# Patient Record
Sex: Female | Born: 1970 | Race: White | Hispanic: Yes | Marital: Married | Smoking: Never smoker
Health system: Southern US, Community
[De-identification: ages and names within clinical notes are randomized; demographics above are authoritative.]

## PROBLEM LIST (undated history)

## (undated) DIAGNOSIS — F419 Anxiety disorder, unspecified: Secondary | ICD-10-CM

## (undated) DIAGNOSIS — I10 Essential (primary) hypertension: Secondary | ICD-10-CM

## (undated) DIAGNOSIS — E785 Hyperlipidemia, unspecified: Secondary | ICD-10-CM

## (undated) HISTORY — DX: Hyperlipidemia, unspecified: E78.5

## (undated) HISTORY — DX: Anxiety disorder, unspecified: F41.9

## (undated) HISTORY — PX: TUBAL LIGATION: SHX77

---

## 2017-04-27 ENCOUNTER — Encounter: Payer: Self-pay | Admitting: Obstetrics and Gynecology

## 2017-04-27 ENCOUNTER — Ambulatory Visit (INDEPENDENT_AMBULATORY_CARE_PROVIDER_SITE_OTHER): Payer: Medicare Other | Admitting: Obstetrics and Gynecology

## 2017-04-27 VITALS — BP 153/104 | HR 80 | Ht 64.0 in | Wt 130.3 lb

## 2017-04-27 DIAGNOSIS — Z9851 Tubal ligation status: Secondary | ICD-10-CM

## 2017-04-27 DIAGNOSIS — Z98891 History of uterine scar from previous surgery: Secondary | ICD-10-CM

## 2017-04-27 DIAGNOSIS — Z01419 Encounter for gynecological examination (general) (routine) without abnormal findings: Secondary | ICD-10-CM | POA: Diagnosis not present

## 2017-04-27 DIAGNOSIS — Z8759 Personal history of other complications of pregnancy, childbirth and the puerperium: Secondary | ICD-10-CM | POA: Diagnosis not present

## 2017-04-27 DIAGNOSIS — Z1239 Encounter for other screening for malignant neoplasm of breast: Secondary | ICD-10-CM | POA: Diagnosis not present

## 2017-04-27 DIAGNOSIS — Z124 Encounter for screening for malignant neoplasm of cervix: Secondary | ICD-10-CM

## 2017-04-27 NOTE — Patient Instructions (Signed)
1. Pap smear is done 2. Mammogram is scheduled for November 2018 3. Screening labs are done through primary care 4. Contraception-tubal ligation 5. Continue with healthy eating and exercise 6. Return in 1 year for annual exam  Health Maintenance, Female Adopting a healthy lifestyle and getting preventive care can go a long way to promote health and wellness. Talk with your health care provider about what schedule of regular examinations is right for you. This is a good chance for you to check in with your provider about disease prevention and staying healthy. In between checkups, there are plenty of things you can do on your own. Experts have done a lot of research about which lifestyle changes and preventive measures are most likely to keep you healthy. Ask your health care provider for more information. Weight and diet Eat a healthy diet  Be sure to include plenty of vegetables, fruits, low-fat dairy products, and lean protein.  Do not eat a lot of foods high in solid fats, added sugars, or salt.  Get regular exercise. This is one of the most important things you can do for your health. ? Most adults should exercise for at least 150 minutes each week. The exercise should increase your heart rate and make you sweat (moderate-intensity exercise). ? Most adults should also do strengthening exercises at least twice a week. This is in addition to the moderate-intensity exercise.  Maintain a healthy weight  Body mass index (BMI) is a measurement that can be used to identify possible weight problems. It estimates body fat based on height and weight. Your health care provider can help determine your BMI and help you achieve or maintain a healthy weight.  For females 32 years of age and older: ? A BMI below 18.5 is considered underweight. ? A BMI of 18.5 to 24.9 is normal. ? A BMI of 25 to 29.9 is considered overweight. ? A BMI of 30 and above is considered obese.  Watch levels of cholesterol  and blood lipids  You should start having your blood tested for lipids and cholesterol at 44 years of age, then have this test every 5 years.  You may need to have your cholesterol levels checked more often if: ? Your lipid or cholesterol levels are high. ? You are older than 44 years of age. ? You are at high risk for heart disease.  Cancer screening Lung Cancer  Lung cancer screening is recommended for adults 44-42 years old who are at high risk for lung cancer because of a history of smoking.  A yearly low-dose CT scan of the lungs is recommended for people who: ? Currently smoke. ? Have quit within the past 15 years. ? Have at least a 30-pack-year history of smoking. A pack year is smoking an average of one pack of cigarettes a day for 1 year.  Yearly screening should continue until it has been 15 years since you quit.  Yearly screening should stop if you develop a health problem that would prevent you from having lung cancer treatment.  Breast Cancer  Practice breast self-awareness. This means understanding how your breasts normally appear and feel.  It also means doing regular breast self-exams. Let your health care provider know about any changes, no matter how small.  If you are in your 20s or 30s, you should have a clinical breast exam (CBE) by a health care provider every 1-3 years as part of a regular health exam.  If you are 40 or older, have  a CBE every year. Also consider having a breast X-ray (mammogram) every year.  If you have a family history of breast cancer, talk to your health care provider about genetic screening.  If you are at high risk for breast cancer, talk to your health care provider about having an MRI and a mammogram every year.  Breast cancer gene (BRCA) assessment is recommended for women who have family members with BRCA-related cancers. BRCA-related cancers include: ? Breast. ? Ovarian. ? Tubal. ? Peritoneal cancers.  Results of the  assessment will determine the need for genetic counseling and BRCA1 and BRCA2 testing.  Cervical Cancer Your health care provider may recommend that you be screened regularly for cancer of the pelvic organs (ovaries, uterus, and vagina). This screening involves a pelvic examination, including checking for microscopic changes to the surface of your cervix (Pap test). You may be encouraged to have this screening done every 3 years, beginning at age 88.  For women ages 63-65, health care providers may recommend pelvic exams and Pap testing every 3 years, or they may recommend the Pap and pelvic exam, combined with testing for human papilloma virus (HPV), every 5 years. Some types of HPV increase your risk of cervical cancer. Testing for HPV may also be done on women of any age with unclear Pap test results.  Other health care providers may not recommend any screening for nonpregnant women who are considered low risk for pelvic cancer and who do not have symptoms. Ask your health care provider if a screening pelvic exam is right for you.  If you have had past treatment for cervical cancer or a condition that could lead to cancer, you need Pap tests and screening for cancer for at least 20 years after your treatment. If Pap tests have been discontinued, your risk factors (such as having a new sexual partner) need to be reassessed to determine if screening should resume. Some women have medical problems that increase the chance of getting cervical cancer. In these cases, your health care provider may recommend more frequent screening and Pap tests.  Colorectal Cancer  This type of cancer can be detected and often prevented.  Routine colorectal cancer screening usually begins at 44 years of age and continues through 44 years of age.  Your health care provider may recommend screening at an earlier age if you have risk factors for colon cancer.  Your health care provider may also recommend using home test  kits to check for hidden blood in the stool.  A small camera at the end of a tube can be used to examine your colon directly (sigmoidoscopy or colonoscopy). This is done to check for the earliest forms of colorectal cancer.  Routine screening usually begins at age 59.  Direct examination of the colon should be repeated every 5-10 years through 44 years of age. However, you may need to be screened more often if early forms of precancerous polyps or small growths are found.  Skin Cancer  Check your skin from head to toe regularly.  Tell your health care provider about any new moles or changes in moles, especially if there is a change in a mole's shape or color.  Also tell your health care provider if you have a mole that is larger than the size of a pencil eraser.  Always use sunscreen. Apply sunscreen liberally and repeatedly throughout the day.  Protect yourself by wearing long sleeves, pants, a wide-brimmed hat, and sunglasses whenever you are outside.  Heart  disease, diabetes, and high blood pressure  High blood pressure causes heart disease and increases the risk of stroke. High blood pressure is more likely to develop in: ? People who have blood pressure in the high end of the normal range (130-139/85-89 mm Hg). ? People who are overweight or obese. ? People who are African American.  If you are 82-54 years of age, have your blood pressure checked every 3-5 years. If you are 78 years of age or older, have your blood pressure checked every year. You should have your blood pressure measured twice-once when you are at a hospital or clinic, and once when you are not at a hospital or clinic. Record the average of the two measurements. To check your blood pressure when you are not at a hospital or clinic, you can use: ? An automated blood pressure machine at a pharmacy. ? A home blood pressure monitor.  If you are between 51 years and 43 years old, ask your health care provider if you  should take aspirin to prevent strokes.  Have regular diabetes screenings. This involves taking a blood sample to check your fasting blood sugar level. ? If you are at a normal weight and have a low risk for diabetes, have this test once every three years after 44 years of age. ? If you are overweight and have a high risk for diabetes, consider being tested at a younger age or more often. Preventing infection Hepatitis B  If you have a higher risk for hepatitis B, you should be screened for this virus. You are considered at high risk for hepatitis B if: ? You were born in a country where hepatitis B is common. Ask your health care provider which countries are considered high risk. ? Your parents were born in a high-risk country, and you have not been immunized against hepatitis B (hepatitis B vaccine). ? You have HIV or AIDS. ? You use needles to inject street drugs. ? You live with someone who has hepatitis B. ? You have had sex with someone who has hepatitis B. ? You get hemodialysis treatment. ? You take certain medicines for conditions, including cancer, organ transplantation, and autoimmune conditions.  Hepatitis C  Blood testing is recommended for: ? Everyone born from [birth year] through 1963. ? Anyone with known risk factors for hepatitis C.  Sexually transmitted infections (STIs)  You should be screened for sexually transmitted infections (STIs) including gonorrhea and chlamydia if: ? You are sexually active and are younger than 44 years of age. ? You are older than 44 years of age and your health care provider tells you that you are at risk for this type of infection. ? Your sexual activity has changed since you were last screened and you are at an increased risk for chlamydia or gonorrhea. Ask your health care provider if you are at risk.  If you do not have HIV, but are at risk, it may be recommended that you take a prescription medicine daily to prevent HIV infection. This is  called pre-exposure prophylaxis (PrEP). You are considered at risk if: ? You are sexually active and do not regularly use condoms or know the HIV status of your partner(s). ? You take drugs by injection. ? You are sexually active with a partner who has HIV.  Talk with your health care provider about whether you are at high risk of being infected with HIV. If you choose to begin PrEP, you should first be tested for HIV. You  should then be tested every 3 months for as long as you are taking PrEP. Pregnancy  If you are premenopausal and you may become pregnant, ask your health care provider about preconception counseling.  If you may become pregnant, take 400 to 800 micrograms (mcg) of folic acid every day.  If you want to prevent pregnancy, talk to your health care provider about birth control (contraception). Osteoporosis and menopause  Osteoporosis is a disease in which the bones lose minerals and strength with aging. This can result in serious bone fractures. Your risk for osteoporosis can be identified using a bone density scan.  If you are 43 years of age or older, or if you are at risk for osteoporosis and fractures, ask your health care provider if you should be screened.  Ask your health care provider whether you should take a calcium or vitamin D supplement to lower your risk for osteoporosis.  Menopause may have certain physical symptoms and risks.  Hormone replacement therapy may reduce some of these symptoms and risks. Talk to your health care provider about whether hormone replacement therapy is right for you. Follow these instructions at home:  Schedule regular health, dental, and eye exams.  Stay current with your immunizations.  Do not use any tobacco products including cigarettes, chewing tobacco, or electronic cigarettes.  If you are pregnant, do not drink alcohol.  If you are breastfeeding, limit how much and how often you drink alcohol.  Limit alcohol intake to  no more than 1 drink per day for nonpregnant women. One drink equals 12 ounces of beer, 5 ounces of wine, or 1 ounces of hard liquor.  Do not use street drugs.  Do not share needles.  Ask your health care provider for help if you need support or information about quitting drugs.  Tell your health care provider if you often feel depressed.  Tell your health care provider if you have ever been abused or do not feel safe at home. This information is not intended to replace advice given to you by your health care provider. Make sure you discuss any questions you have with your health care provider. Document Released: 05/05/2011 Document Revised: 03/27/2016 Document Reviewed: 07/24/2015 Elsevier Interactive Patient Education  [employer].

## 2017-04-27 NOTE — Progress Notes (Signed)
ANNUAL PREVENTATIVE CARE GYN  ENCOUNTER NOTE  Subjective:       Betty Vasquez is a 46 y.o. 382P2002 female here for a routine annual gynecologic exam.  Current complaints: 1.  None  Patient presents to the office today for routine annual history and physical exam [facility] Dr[facility] Leana RoeManhano  She states she has regular menstrual cycles and denies menorrhagia, and dysmenorrhea.  She denies any perimenopausal symptoms or vasomotor symptoms.  She has a history or 2 C-Sections due to large babies without gestational diabetes and tubal ligation for contraception.  Patient is currently not sexually active as her husband is currently in [place]. She admits to a history of cysts in her breast 2 years ago the resolved spontaneously and were not visualized on ultrasound.  No biopsy performed.  Patient has had Mammogram in Nov 2017 with normal results.  She has had normal PAP smears.  No family history of cancer.  Gynecologic History Patient's last menstrual period was 04/06/2017 (exact date). Contraception: abstinence- tubal ligation Last Pap: 04/2016 wnl. Results were: normal Last mammogram: 2017. Results were: normal  Obstetric History OB History  Gravida Para Term Preterm AB Living  2 2 2     2   SAB TAB Ectopic Multiple Live Births          2    # Outcome Date GA Lbr Len/2nd Weight Sex Delivery Anes PTL Lv  2 Term 2006   7 lb 12.8 oz (3.538 kg) M CS-LTranv   LIV  1 Term 1998   8 lb 2.4 oz (3.697 kg) M CS-LTranv   LIV      History reviewed. No pertinent past medical history.  Past Surgical History:  Procedure Laterality Date  . CESAREAN SECTION     2    No current outpatient prescriptions on file prior to visit.   No current facility-administered medications on file prior to visit.     Allergies  Allergen Reactions  . Codeine Palpitations    Social History   Social History  . Marital status: Single    Spouse name: N/A  . Number of children: N/A   . Years of education: N/A   Occupational History  . Not on file.   Social History Main Topics  . Smoking status: Not on file  . Smokeless tobacco: Not on file  . Alcohol use Not on file  . Drug use: Unknown  . Sexual activity: Not on file   Other Topics Concern  . Not on file   Social History Narrative  . No narrative on file    History reviewed. No pertinent family history.  The following portions of the patient's history were reviewed and updated as appropriate: allergies, current medications, past family history, past medical history, past social history, past surgical history and problem list.  Review of Systems ROS Review of Systems - General ROS: negative for - chills, fatigue, fever, hot flashes, night sweats, weight gain or weight loss Psychological ROS: negative for - anxiety, decreased libido, depression, mood swings, physical abuse or sexual abuse Ophthalmic ROS: negative for - blurry vision, eye pain or loss of vision ENT ROS: negative for - headaches, hearing change, visual changes or vocal changes Allergy and Immunology ROS: negative for - hives, itchy/watery eyes or seasonal allergies Hematological and Lymphatic ROS: negative for - bleeding problems, bruising, swollen lymph nodes or weight loss Endocrine ROS: negative for - galactorrhea, hair pattern changes, hot flashes, malaise/lethargy, mood swings, palpitations, polydipsia/polyuria, skin  changes, temperature intolerance or unexpected weight changes Breast ROS: negative for - new or changing breast lumps or nipple discharge Respiratory ROS: negative for - cough or shortness of breath Cardiovascular ROS: negative for - chest pain, irregular heartbeat, palpitations or shortness of breath Gastrointestinal ROS: no abdominal pain, change in bowel habits, or black or bloody stools Genito-Urinary ROS: no dysuria, trouble voiding, or hematuria Musculoskeletal ROS: negative for - joint pain or joint  stiffness Neurological ROS: negative for - bowel and bladder control changes Dermatological ROS: negative for rash and skin lesion changes   Objective:   BP (!) 153/104   Pulse 80   Ht 5\' 4"  (1.626 m)   Wt 130 lb 4.8 oz (59.1 kg)   LMP 04/06/2017 (Exact Date)   BMI 22.37 kg/m  CONSTITUTIONAL: Well-developed, well-nourished female in no acute distress.  PSYCHIATRIC: Normal mood and affect. Normal behavior. Normal judgment and thought content. NEUROLGIC: Alert and oriented to person, place, and time. Normal muscle tone coordination. No cranial nerve deficit noted. HENT:  Normocephalic, atraumatic, External right and left ear normal. Oropharynx is clear and moist EYES: Conjunctivae and EOM are normal. Pupils are equal, round, and reactive to light. No scleral icterus.  NECK: Normal range of motion, supple, no masses.  Normal thyroid.  SKIN: Skin is warm and dry. No rash noted. Not diaphoretic. No erythema. No pallor. CARDIOVASCULAR: Normal heart rate noted, regular rhythm, no murmur. RESPIRATORY: Clear to auscultation bilaterally. Effort and breath sounds normal, no problems with respiration noted. BREASTS: Symmetric in size. No masses, skin changes, nipple drainage, or lymphadenopathy. ABDOMEN: Soft, normal bowel sounds, no distention noted.  No tenderness, rebound or guarding. Well-healed Pfannenstiel incision BLADDER: Normal PELVIC:  External Genitalia: Normal  BUS: Normal  Vagina: Normal  Cervix: Normal; mobile non tender; no lesions  Uterus: Normal- anteroverted; mobile, nontender  Adnexa: Normal- non tender, nonpalpable  RV: External Exam NormaI, No Rectal Masses and Normal Sphincter tone  MUSCULOSKELETAL: Normal range of motion. No tenderness.  No cyanosis, clubbing, or edema.  2+ distal pulses. LYMPHATIC: No Axillary, Supraclavicular, or Inguinal Adenopathy.    Assessment:   Annual gynecologic examination 46 y.o. Contraception: abstinence- tubal ligation Normal  BMI History of cesarean section 2  Plan:  Pap: Pap Co Test Mammogram: Ordered Stool Guaiac Testing:  Not Indicated Labs: pcp Routine preventative health maintenance measures emphasized: Exercise/Diet/Weight control, Tobacco Warnings, Alcohol/Substance use risks, Stress Management, Peer Pressure Issues and Safe Sex Return to Clinic - 1 [address] [place], CMA [place] Melter, PA-S [employer] Herold Harms, MD   I have seen, interviewed, and examined the patient in conjunction with the [employer].A. student and affirm the diagnosis and management plan. Ermal Brzozowski A. Garvin Ellena, MD, FACOG   Note: This dictation was prepared with Dragon dictation along with smaller phrase technology. Any transcriptional errors that result from this process are unintentional.

## 2017-04-29 LAB — PAP IG AND HPV HIGH-RISK
HPV, high-risk: NEGATIVE
PAP SMEAR COMMENT: 0

## 2017-09-22 ENCOUNTER — Ambulatory Visit
Admission: RE | Admit: 2017-09-22 | Discharge: 2017-09-22 | Disposition: A | Discharge status: Home / Self Care | Payer: Self-pay | Source: Ambulatory Visit | Attending: Obstetrics and Gynecology | Admitting: Obstetrics and Gynecology

## 2017-09-22 ENCOUNTER — Encounter: Payer: Self-pay | Admitting: Radiology

## 2017-09-22 DIAGNOSIS — Z1239 Encounter for other screening for malignant neoplasm of breast: Secondary | ICD-10-CM

## 2017-09-22 DIAGNOSIS — Z1231 Encounter for screening mammogram for malignant neoplasm of breast: Secondary | ICD-10-CM | POA: Insufficient documentation

## 2017-09-28 ENCOUNTER — Other Ambulatory Visit: Payer: Self-pay | Admitting: Obstetrics and Gynecology

## 2017-09-28 DIAGNOSIS — N6310 Unspecified lump in the right breast, unspecified quadrant: Secondary | ICD-10-CM

## 2017-09-28 DIAGNOSIS — R928 Other abnormal and inconclusive findings on diagnostic imaging of breast: Secondary | ICD-10-CM

## 2017-10-20 ENCOUNTER — Ambulatory Visit
Admission: RE | Admit: 2017-10-20 | Discharge: 2017-10-20 | Disposition: A | Discharge status: Home / Self Care | Payer: Medicaid Other | Source: Ambulatory Visit | Attending: Obstetrics and Gynecology | Admitting: Obstetrics and Gynecology

## 2017-10-20 DIAGNOSIS — R928 Other abnormal and inconclusive findings on diagnostic imaging of breast: Secondary | ICD-10-CM

## 2017-10-20 DIAGNOSIS — N6310 Unspecified lump in the right breast, unspecified quadrant: Secondary | ICD-10-CM

## 2018-02-02 ENCOUNTER — Other Ambulatory Visit: Payer: Self-pay | Admitting: Neurology

## 2018-02-02 DIAGNOSIS — E348 Other specified endocrine disorders: Secondary | ICD-10-CM

## 2018-02-10 ENCOUNTER — Other Ambulatory Visit: Payer: Self-pay | Admitting: Neurology

## 2018-02-10 ENCOUNTER — Ambulatory Visit (HOSPITAL_COMMUNITY): Payer: Self-pay

## 2018-02-10 DIAGNOSIS — E348 Other specified endocrine disorders: Secondary | ICD-10-CM

## 2018-02-16 ENCOUNTER — Ambulatory Visit: Payer: Self-pay

## 2018-02-16 ENCOUNTER — Ambulatory Visit: Payer: Medicare (Managed Care)

## 2018-02-19 ENCOUNTER — Ambulatory Visit: Payer: Self-pay

## 2018-04-20 NOTE — Progress Notes (Deleted)
ANNUAL PREVENTATIVE CARE GYN  ENCOUNTER NOTE  Subjective:       Betty Vasquez is a 47 y.o. G48P2002 female here for a routine annual gynecologic exam.  Current complaints: 1.  None  Patient presents to the office today for routine annual history and physical exam referral from [facility]- Dr. Leana Roe  She states she has regular menstrual cycles and denies menorrhagia, and dysmenorrhea.  She denies any perimenopausal symptoms or vasomotor symptoms.  She has a history or 2 C-Sections due to large babies without gestational diabetes and tubal ligation for contraception.  Patient is currently not sexually active as her husband is currently in [place] She admits to a history of cysts in her breast 2 years ago the resolved spontaneously and were not visualized on ultrasound.  No biopsy performed.  Patient has had Mammogram in Nov 2017 with normal results.  She has had normal PAP smears.  No family history of cancer.  Gynecologic History No LMP recorded. Contraception: abstinence- tubal ligation Last Pap: 04/27/2017 neg/neg wnl. Results were: normal Last mammogram: 10/2017 birad 1 Results were: normal  Obstetric History OB History  Gravida Para Term Preterm AB Living  2 2 2     2   SAB TAB Ectopic Multiple Live Births          2    # Outcome Date GA Lbr Len/2nd Weight Sex Delivery Anes PTL Lv  2 Term 2006   7 lb 12.8 oz (3.538 kg) M CS-LTranv   LIV  1 Term 1998   8 lb 2.4 oz (3.697 kg) M CS-LTranv   LIV    No past medical history on file.  Past Surgical History:  Procedure Laterality Date  . CESAREAN SECTION     2    No current outpatient medications on file prior to visit.   No current facility-administered medications on file prior to visit.     Allergies  Allergen Reactions  . Codeine Palpitations    Social History   Socioeconomic History  . Marital status: Married    Spouse name: Not on file  . Number of children: Not on file  . Years of education:  Not on file  . Highest education level: Not on file  Occupational History  . Not on file  Social Needs  . Financial resource strain: Not on file  . Food insecurity:    Worry: Not on file    Inability: Not on file  . Transportation needs:    Medical: Not on file    Non-medical: Not on file  Tobacco Use  . Smoking status: Never Smoker  . Smokeless tobacco: Never Used  Substance and Sexual Activity  . Alcohol use: No  . Drug use: No  . Sexual activity: Not Currently  Lifestyle  . Physical activity:    Days per week: Not on file    Minutes per session: Not on file  . Stress: Not on file  Relationships  . Social connections:    Talks on phone: Not on file    Gets together: Not on file    Attends religious service: Not on file    Active member of club or organization: Not on file    Attends meetings of clubs or organizations: Not on file    Relationship status: Not on file  . Intimate partner violence:    Fear of current or ex partner: Not on file    Emotionally abused: Not on file    Physically abused:  Not on file    Forced sexual activity: Not on file  Other Topics Concern  . Not on file  Social History Narrative  . Not on file    Family History  Problem Relation Age of Onset  . Breast cancer Neg Hx   . Ovarian cancer Neg Hx   . Colon cancer Neg Hx   . Diabetes Neg Hx     The following portions of the patient's history were reviewed and updated as appropriate: allergies, current medications, past family history, past medical history, past social history, past surgical history and problem list.  Review of Systems ROS   Objective:   There were no vitals taken for this visit. CONSTITUTIONAL: Well-developed, well-nourished female in no acute distress.  PSYCHIATRIC: Normal mood and affect. Normal behavior. Normal judgment and thought content. NEUROLGIC: Alert and oriented to person, place, and time. Normal muscle tone coordination. No cranial nerve deficit  noted. HENT:  Normocephalic, atraumatic, External right and left ear normal. Oropharynx is clear and moist EYES: Conjunctivae and EOM are normal. Pupils are equal, round, and reactive to light. No scleral icterus.  NECK: Normal range of motion, supple, no masses.  Normal thyroid.  SKIN: Skin is warm and dry. No rash noted. Not diaphoretic. No erythema. No pallor. CARDIOVASCULAR: Normal heart rate noted, regular rhythm, no murmur. RESPIRATORY: Clear to auscultation bilaterally. Effort and breath sounds normal, no problems with respiration noted. BREASTS: Symmetric in size. No masses, skin changes, nipple drainage, or lymphadenopathy. ABDOMEN: Soft, normal bowel sounds, no distention noted.  No tenderness, rebound or guarding. Well-healed Pfannenstiel incision BLADDER: Normal PELVIC:  External Genitalia: Normal  BUS: Normal  Vagina: Normal  Cervix: Normal; mobile non tender; no lesions  Uterus: Normal- anteroverted; mobile, nontender  Adnexa: Normal- non tender, nonpalpable  RV: External Exam NormaI, No Rectal Masses and Normal Sphincter tone  MUSCULOSKELETAL: Normal range of motion. No tenderness.  No cyanosis, clubbing, or edema.  2+ distal pulses. LYMPHATIC: No Axillary, Supraclavicular, or Inguinal Adenopathy.    Assessment:   Annual gynecologic examination 47 y.o. Contraception: abstinence- tubal ligation Normal BMI History of cesarean section 2  Plan:  Pap:Due 2021 Mammogram: Ordered Stool Guaiac Testing:  Not Indicated Labs: pcp Routine preventative health maintenance measures emphasized: Exercise/Diet/Weight control, Tobacco Warnings, Alcohol/Substance use risks, Stress Management, Peer Pressure Issues and Safe Sex Return to Clinic - 1 Year  SunGardCrystal Aleece Loyd, CMA   I have seen, interviewed, and examined the patient in conjunction with the [employer] student and affirm the diagnosis and management plan. Martin A. DeFrancesco, MD, FACOG   Note: This dictation  was prepared with Dragon dictation along with smaller phrase technology. Any transcriptional errors that result from this process are unintentional.

## 2018-04-29 ENCOUNTER — Encounter: Payer: Medicare Other | Admitting: Obstetrics and Gynecology

## 2018-07-29 NOTE — Progress Notes (Signed)
ANNUAL PREVENTATIVE CARE GYN  ENCOUNTER NOTE  Subjective:       Betty Vasquez is a 47 y.o. G22P2002 female here for a routine annual gynecologic exam.  Current complaints: 1.  None  Patient presents to the office today for routine annual history and physical exam . She states she has regular menstrual cycles and denies menorrhagia, and dysmenorrhea.  She denies any perimenopausal symptoms or vasomotor symptoms.  She has a history or 2 C-Sections due to large babies without gestational diabetes and tubal ligation for contraception. She admits to a history of cysts in her breast 2 years ago the resolved spontaneously and were not visualized on ultrasound. No family history of cancer. Contraception is tubal ligation.  No major interval health issues have been identified.  Gynecologic History LMP-07/23/2018 Contraception:- tubal ligation Last Pap: 04/2017 neg/neg  wnl. Results were: normal Last mammogram: 10/2017 birad 1 Results were: normal  Obstetric History OB History  Gravida Para Term Preterm AB Living  2 2 2     2   SAB TAB Ectopic Multiple Live Births          2    # Outcome Date GA Lbr Len/2nd Weight Sex Delivery Anes PTL Lv  2 Term 2006   7 lb 12.8 oz (3.538 kg) M CS-LTranv   LIV  1 Term 1998   8 lb 2.4 oz (3.697 kg) M CS-LTranv   LIV    No past medical history on file.  Past Surgical History:  Procedure Laterality Date  . CESAREAN SECTION     2    No current outpatient medications on file prior to visit.   No current facility-administered medications on file prior to visit.     Allergies  Allergen Reactions  . Codeine Palpitations    Social History   Socioeconomic History  . Marital status: Married    Spouse name: Not on file  . Number of children: Not on file  . Years of education: Not on file  . Highest education level: Not on file  Occupational History  . Not on file  Social Needs  . Financial resource strain: Not on file  . Food insecurity:    Worry:  Not on file    Inability: Not on file  . Transportation needs:    Medical: Not on file    Non-medical: Not on file  Tobacco Use  . Smoking status: Never Smoker  . Smokeless tobacco: Never Used  Substance and Sexual Activity  . Alcohol use: No  . Drug use: No  . Sexual activity: Not Currently  Lifestyle  . Physical activity:    Days per week: Not on file    Minutes per session: Not on file  . Stress: Not on file  Relationships  . Social connections:    Talks on phone: Not on file    Gets together: Not on file    Attends religious service: Not on file    Active member of club or organization: Not on file    Attends meetings of clubs or organizations: Not on file    Relationship status: Not on file  . Intimate partner violence:    Fear of current or ex partner: Not on file    Emotionally abused: Not on file    Physically abused: Not on file    Forced sexual activity: Not on file  Other Topics Concern  . Not on file  Social History Narrative  . Not on file    Family  History  Problem Relation Age of Onset  . Breast cancer Neg Hx   . Ovarian cancer Neg Hx   . Colon cancer Neg Hx   . Diabetes Neg Hx     The following portions of the patient's history were reviewed and updated as appropriate: allergies, current medications, past family history, past medical history, past social history, past surgical history and problem list.  Review of Systems Review of Systems  Constitutional: Negative.   HENT: Negative.   Eyes: Negative.   Respiratory: Negative.   Cardiovascular: Negative.   Gastrointestinal: Negative.   Genitourinary: Negative.   Musculoskeletal: Negative.   Skin: Negative.   Neurological: Negative.   Endo/Heme/Allergies: Negative.   Psychiatric/Behavioral: Negative.      Objective:   BP (!) 170/94   Pulse 80   Ht 5\' 4"  (1.626 m)   Wt 149 lb 4.8 oz (67.7 kg)   LMP 07/23/2018 (Exact Date)   BMI 25.63 kg/m  CONSTITUTIONAL: Well-developed,  well-nourished female in no acute distress.  PSYCHIATRIC: Normal mood and affect. Normal behavior. Normal judgment and thought content. NEUROLGIC: Alert and oriented to person, place, and time. Normal muscle tone coordination. No cranial nerve deficit noted. HENT:  Normocephalic, atraumatic, External right and left ear normal.  EYES: Conjunctivae and EOM are normal. . No scleral icterus.  NECK: Normal range of motion, supple, no masses.  Normal thyroid.  SKIN: Skin is warm and dry. No rash noted. Not diaphoretic. No erythema. No pallor. CARDIOVASCULAR: Normal heart rate noted, regular rhythm, no murmur. RESPIRATORY: Clear to auscultation bilaterally. Effort and breath sounds normal, no problems with respiration noted. BREASTS: Symmetric in size. No masses, skin changes, nipple drainage, or lymphadenopathy. ABDOMEN: Soft, normal bowel sounds, no distention noted.  No tenderness, rebound or guarding. Well-healed Pfannenstiel incision BLADDER: Normal PELVIC: (Unchanged from last visit)  External Genitalia: Normal  BUS: Normal  Vagina: Normal  Cervix: Normal; mobile non tender; no lesions  Uterus: Normal- anteroverted; mobile, nontender  Adnexa: Normal- non tender, nonpalpable  RV: External Exam NormaI, No Rectal Masses and Normal Sphincter tone  MUSCULOSKELETAL: Normal range of motion. No tenderness.  No cyanosis, clubbing, or edema.  2+ distal pulses. LYMPHATIC: No Axillary, Supraclavicular, or Inguinal Adenopathy.    Assessment:   Annual gynecologic examination 47 y.o. Contraception:  tubal ligation Normal BMI History of cesarean section 2 Elevated blood pressure Plan:  Pap: Due 2021 Mammogram: Ordered Stool Guaiac Testing:  Not Indicated Labs: lipid tsh a1c fbs Routine preventative health maintenance measures emphasized: Exercise/Diet/Weight control, Tobacco Warnings, Alcohol/Substance use risks, Stress Management, Peer Pressure Issues and Safe Sex  Maintain blood pressure  diary; follow-up with primary care for further work-up of possible hypertension Return to Clinic - 1 Year  Crystal Bruni, CMA   [employer]  am acting as a Neurosurgeon for [employer].  I have reviewed and concur with the supporting documentation provided by Darol Destine, CMA  Herold Harms, MD  Note: This dictation was prepared with Dragon dictation along with smaller phrase technology. Any transcriptional errors that result from this process are unintentional.

## 2018-08-03 ENCOUNTER — Ambulatory Visit (INDEPENDENT_AMBULATORY_CARE_PROVIDER_SITE_OTHER): Payer: Medicaid Other | Admitting: Obstetrics and Gynecology

## 2018-08-03 ENCOUNTER — Encounter: Payer: Self-pay | Admitting: Obstetrics and Gynecology

## 2018-08-03 VITALS — BP 170/94 | HR 80 | Ht 64.0 in | Wt 149.3 lb

## 2018-08-03 DIAGNOSIS — Z0000 Encounter for general adult medical examination without abnormal findings: Secondary | ICD-10-CM

## 2018-08-03 DIAGNOSIS — Z1239 Encounter for other screening for malignant neoplasm of breast: Secondary | ICD-10-CM

## 2018-08-03 DIAGNOSIS — R030 Elevated blood-pressure reading, without diagnosis of hypertension: Secondary | ICD-10-CM

## 2018-08-03 DIAGNOSIS — Z98891 History of uterine scar from previous surgery: Secondary | ICD-10-CM

## 2018-08-03 DIAGNOSIS — Z01419 Encounter for gynecological examination (general) (routine) without abnormal findings: Secondary | ICD-10-CM

## 2018-08-03 DIAGNOSIS — Z9851 Tubal ligation status: Secondary | ICD-10-CM

## 2018-08-03 NOTE — Patient Instructions (Addendum)
1.  Pap smear is not done.  Next Pap smear is due 2021. 2.  Mammogram is ordered. 3.  Screening labs are ordered. 4.  Continue with healthy eating and exercise. 5.  Return in 1 year for annual exam 6.  Maintain blood pressure diary; take to primary care provider for further evaluation of possible hypertension.  [place] (Health Maintenance, Female) Un estilo de vida saludable y los cuidados preventivos pueden favorecer considerablemente a la salud y Musician. Pregunte a su mdico cul es el cronograma de exmenes peridicos apropiado para usted. Esta es una buena oportunidad para consultarlo sobre cmo prevenir enfermedades y [place] sano. Adems de los controles, hay muchas otras cosas que puede hacer usted mismo. Los expertos han realizado numerosas investigaciones [employer] cambios en el estilo de vida y las medidas de prevencin que, [place], lo ayudarn a mantenerse sano. Solicite a su mdico ms informacin. EL PESO Y LA DIETA Consuma una dieta saludable.  Asegrese de [employer] verduras, frutas, productos lcteos de bajo contenido de [place] y Advertising account planner.  No consuma muchos alimentos de alto contenido de grasas slidas, azcares agregados o sal.  Realice actividad fsica con regularidad. Esta es una de las prcticas ms importantes que puede hacer por su salud. ? La Delorise Shiner de los adultos deben hacer ejercicio durante al menos 130mnutos por semana. El ejercicio debe aumentar la frecuencia cardaca y pActorla transpiracin (ejercicio de [employer]. ? La mayora de los adultos tambin deben hacer ejercicios de elongacin al mToysRusveces a la semana. Agregue esto al su plan de ejercicio de intensidad moderada. Mantenga un peso saludable.  El ndice de masa corporal ([facility] es una medida que puede utilizarse para identificar posibles problemas de pYucaipa Proporciona una estimacin de la grasa corporal basndose en el peso y  la altura. Su mdico puede ayudarle a dRadiation protection practitionerILocklandy a lScientist, forensico mTheatre managerun peso saludable.  Para las mujeres de 20aos o ms: ? Un IMetro Health Asc LLC Dba Metro Health Oam Surgery Centermenor de 18,5 se considera bajo peso. ? Un IWillow Springs Centerentre 18,5 y 24,9 es normal. ? Un IConnecticut Childrens Medical Centerentre 25 y 29,9 se considera sobrepeso. ? Un IMC de 30 o ms se considera obesidad. Observe los niveles de colesterol y lpidos en la sangre.  Debe comenzar a rEnglish as a second language teacherde lpidos y cResearch officer, trade unionen la sangre a los 20aos y luego repetirlos cada 566aos  Es posible que nAutomotive engineerlos niveles de colesterol con mayor frecuencia si: ? Sus niveles de lpidos y colesterol son altos. ? Es mayor de [id number] ? Presenta un alto riesgo de padecer enfermedades cardacas. DETECCIN DE CNCER Cncer de pulmn  Se recomienda realizar exmenes de deteccin de cncer de pulmn a personas adultas entre 523y 879aos que estn en riesgo de dHorticulturist, commercialde pulmn por sus antecedentes de consumo de tabaco.  Se recomienda una tomografa computarizada de baja dosis de los pulmones todos los aos a las personas que: ? Fuman actualmente. ? Hayan dejado el hbito en algn momento en los ltimos 15aos. ? Hayan fumado durante 30aos un paquete diario. Un paquete-ao equivale a fumar un promedio de un paquete de cigarrillos diario durante un ao.  Los exmenes de deteccin anuales deben continuar hasta que hayan pasado 15aos desde que dej de fumar.  Ya no debern realizarse si tiene un problema de salud que le impida recibir tratamiento para eScience writerde pulmn. Cncer de mama  Practique la autoconciencia de la mama. Esto significa  reconocer la apariencia normal de sus mamas y cmo las siente.  Tambin significa realizar autoexmenes regulares de [employer]. Informe a su mdico sobre cualquier cambio, sin importar cun pequeo sea.  Si tiene entre 20 y 60 aos, un mdico debe realizarle un examen clnico de las mamas como parte del examen regular de [place], cada  1 a 3aos.  Si tiene 40aos o ms, debe Information systems manager clnico de las Microsoft. Tambin considere realizarse una [place] ([place]) todos los [place].  Si tiene antecedentes familiares de cncer de mama, hable con su mdico para someterse a un estudio gentico.  Si tiene alto riesgo de Chief Financial Officer de mama, hable con su mdico para someterse a Public house manager y [employer].  La evaluacin del gen del cncer de mama (BRCA) se recomienda a mujeres que tengan familiares con cnceres relacionados con el BRCA. Los cnceres relacionados con el BRCA incluyen los siguientes: ? [place]. ? Ovario. ? Trompas. ? Cnceres de peritoneo.  Los resultados de la evaluacin determinarn la necesidad de asesoramiento gentico y de [place] de BRCA1 y BRCA2. Cncer de cuello del tero El mdico puede recomendarle que se haga pruebas peridicas de deteccin de cncer de los rganos de la pelvis (ovarios, tero y vagina). Estas pruebas incluyen un examen plvico, que abarca controlar si se produjeron cambios microscpicos en la superficie del cuello del tero (prueba de Papanicolaou). Pueden recomendarle que se haga estas pruebas cada 3aos, a partir de los 21aos.  A las mujeres que tienen entre 30 y 42aos, los mdicos pueden recomendarles que se sometan a exmenes plvicos y pruebas de Papanicolaou cada 24aos, o a la prueba de Papanicolaou y el examen plvico en combinacin con estudios de deteccin del virus del papiloma humano (VPH) cada 5aos. Algunos tipos de VPH aumentan el riesgo de Chief Financial Officer de cuello del tero. La prueba para la deteccin del VPH tambin puede realizarse a mujeres de cualquier edad cuyos resultados de la prueba de Papanicolaou no sean claros.  Es posible que otros mdicos no recomienden exmenes de deteccin a mujeres no embarazadas que se consideran sujetos de bajo riesgo de Chief Financial Officer de pelvis y que no tienen  sntomas. Pregntele al mdico si un examen plvico de deteccin es adecuado para usted.  Si ha recibido un tratamiento para Science writer cervical o una enfermedad que podra causar cncer, necesitar realizarse una prueba de Papanicolaou y controles durante al menos 35 aos de concluido el [place]. Si no se ha hecho el Papanicolaou con regularidad, debern volver a evaluarse los factores de riesgo (como tener un nuevo compaero sexual), para Teacher, adult education si debe realizarse los estudios nuevamente. Algunas mujeres sufren problemas mdicos que aumentan la probabilidad de Museum/gallery curator cncer de cuello del tero. En estos casos, el mdico podr [employer] se realicen controles y pruebas de Papanicolaou con ms frecuencia. Cncer colorrectal  Este tipo de cncer puede detectarse y a menudo prevenirse.  Por lo general, los estudios de rutina se deben Medical laboratory scientific officer a Field seismologist a Proofreader de los 75 aos y [place] 54 aos.  Sin embargo, el mdico podr aconsejarle que lo haga antes, si tiene factores de riesgo para el cncer de colon.  Tambin puede recomendarle que use un kit de prueba para Hydrologist en la materia fecal.  Es posible que se use una pequea cmara en el extremo de un tubo para examinar directamente el colon (sigmoidoscopia o colonoscopia) a fin de Hydrographic surveyor  formas tempranas de Surveyor, minerals.  Los exmenes de rutina generalmente comienzan a los 50aos.  El examen directo del colon se debe repetir cada 5 a 10aos hasta los 75aos. Sin embargo, es posible que se realicen exmenes con mayor frecuencia, si se detectan formas tempranas de plipos precancerosos o pequeos bultos. Cncer de piel  Revise la piel de la cabeza a los pies con regularidad.  Informe a su mdico si aparecen nuevos lunares o los que tiene se modifican, especialmente en su forma y color.  Tambin notifique al mdico si tiene un lunar que es ms grande que el tamao de una goma de lpiz.  Siempre use pantalla  solar. Aplique pantalla solar de Kerry Dory y repetida a lo largo del Training and development officer.  Protjase usando mangas y [employer], un sombrero de ala ancha y gafas para el sol, siempre que se encuentre en el exterior. ENFERMEDADES CARDACAS, DIABETES E HIPERTENSIN ARTERIAL  La hipertensin arterial causa enfermedades cardacas y [place] el riesgo de ictus. La hipertensin arterial es ms probable en los siguientes casos: ? Las personas que tienen la presin arterial en el extremo del rango normal (100-139/85-89 mm Hg). ? [employer] con sobrepeso u obesidad. ? Scientist, water quality.  Si usted tiene entre 18 y 39 aos, debe medirse la presin arterial cada 3 a 5 aos. Si usted tiene 40 aos o ms, debe medirse la presin arterial [employer]. Debe medirse la presin arterial dos veces: una vez cuando est en un hospital o una clnica y la otra vez cuando est en otro sitio. Registre el promedio de [employer]. Para controlar su presin arterial cuando no est en un hospital o [place], puede usar lo siguiente: ? Jorje Guild automtica para medir la presin arterial en una farmacia. ? Un monitor para medir la presin arterial en el hogar.  Si tiene entre 87 y 60 aos, consulte a su mdico si debe tomar aspirina para prevenir el ictus.  Realcese exmenes de deteccin de la diabetes con regularidad. Esto incluye la toma de [place] de sangre para controlar el nivel de azcar en la sangre durante el [place]. ? Si tiene un peso normal y un bajo riesgo de padecer diabetes, realcese este anlisis cada tres aos despus de los 45aos. ? Si tiene sobrepeso y un alto riesgo de padecer diabetes, considere someterse a este anlisis antes o con mayor frecuencia. PREVENCIN DE INFECCIONES HepatitisB  Si tiene un riesgo ms alto de Museum/gallery curator hepatitis B, debe someterse a un examen de deteccin de este virus. Se considera que tiene un alto riesgo de contraer hepatitis B si: ? Naci en un pas  donde la hepatitis B es frecuente. Pregntele a su mdico qu pases son considerados de Public affairs consultant. ? Sus padres nacieron en un pas de alto riesgo y usted no recibi una vacuna que lo proteja contra la hepatitis B (vacuna contra la hepatitis B). ? [place]. ? [place] agujas para inyectarse drogas. ? Vive con alguien que tiene hepatitis B. ? Ha tenido sexo con alguien que tiene hepatitis B. ? Recibe tratamiento de hemodilisis. ? Toma ciertos medicamentos para el cncer, trasplante de rganos y afecciones autoinmunitarias. Hepatitis C  Se recomienda un anlisis de [place] para: ? [employer] 1945 y 1965. ? Todas las personas que tengan un riesgo de haber contrado hepatitis C. Enfermedades de transmisin sexual (ETS).  Debe realizarse pruebas de deteccin de enfermedades de transmisin sexual (ETS), incluidas gonorrea y  clamidia si: ? Es sexualmente [place] y es menor de [id number]. ? Es mayor de 24aos, y Investment banker, operational informa que corre riesgo de tener este tipo de infecciones. ? La actividad sexual ha cambiado desde que le hicieron la ltima prueba de deteccin y tiene un riesgo mayor de Best boy clamidia o Radio broadcast assistant. Pregntele al mdico si usted tiene riesgo.  Si no tiene el VIH, pero corre riesgo de infectarse por el virus, se recomienda tomar diariamente un medicamento recetado para evitar la infeccin. Esto se conoce como profilaxis previa a la exposicin. Se considera que est en riesgo si: ? Es [place] sexualmente y no [place] preservativos habitualmente o no conoce el estado del VIH de sus Advertising copywriter. ? Se inyecta drogas. ? Es [place] sexualmente con Ardelia Mems pareja que tiene VIH. Consulte a su mdico para saber si tiene un alto riesgo de infectarse por el VIH. Si opta por comenzar la profilaxis previa a la exposicin, primero debe realizarse anlisis de deteccin del VIH. Luego, le harn anlisis cada 60mses mientras est tomando los medicamentos para la profilaxis previa  a la exposicin. [facility] Si es premenopusica y puede quedar eChannel [place] solicite a su mdico asesoramiento previo a la concepcin.  Si puede quedar embarazada, tome 400 a [id number](mcg) de cido fAnheuser-Busch  Si desea evitar el embarazo, hable con su mdico sobre el control de la natalidad (anticoncepcin). OSTEOPOROSIS Y MENOPAUSIA  La osteoporosis es una enfermedad en la que los huesos pierden los minerales y la fuerza por el avance de la edad. El resultado pueden ser fracturas graves en los hMin[place] El riesgo de osteoporosis puede identificarse con uArdelia Memsprueba de densidad sea.  Si tiene 65aos o ms, o si est en riesgo de sufrir osteoporosis y fracturas, pregunte a su mdico si debe someterse a exmenes.  Consulte a su mdico si debe tomar un suplemento de calcio o de vitamina D para reducir el riesgo de osteoporosis.  La menopausia puede presentar ciertos sntomas fsicos y rGaffer  La terapia de reemplazo hormonal puede reducir algunos de estos sntomas y rGaffer Consulte a su mdico para saber si la terapia de reemplazo hormonal es conveniente para usted. INSTRUCCIONES PARA EL CUIDADO EN EL HOGAR  Realcese los estudios de rutina de la salud, dentales y de lPublic librarian  MMacoupin  No consuma ningn producto que contenga tabaco, lo que incluye cigarrillos, tabaco de mHigher education careers advisero cPsychologist, sport and exercise  Si est embarazada, no beba alcohol.  Si est amamantando, reduzca el consumo de alcohol y la frecuencia con la que consume.  Si es mujer y no est embarazada limite el consumo de alcohol a no ms de 1 medida por da. Una medida equivale a 12onzas de cerveza, 5onzas de vino o 1onzas de bebidas alcohlicas de alta graduacin.  No consuma drogas.  No comparta agujas.  Solicite ayuda a su mdico si necesita apoyo o informacin para abandonar las drogas.  Informe a su mdico si a menudo se siente deprimido.  Notifique a su  mdico si alguna vez ha sido vctima de abuso o si no se siente seguro en su hogar. Esta informacin no tiene cMarine scientistel consejo del mdico. Asegrese de hacerle al mdico cualquier pregunta que tenga. Document Released: 10/09/2011 Document Revised: 11/10/2014 Document Reviewed: 07/24/2015 Elsevier Interactive Patient Education  2[employer]

## 2018-08-04 DIAGNOSIS — R030 Elevated blood-pressure reading, without diagnosis of hypertension: Secondary | ICD-10-CM | POA: Insufficient documentation

## 2018-08-06 ENCOUNTER — Other Ambulatory Visit: Payer: Medicaid Other

## 2018-08-07 LAB — LIPID PANEL
CHOL/HDL RATIO: 3.7 ratio (ref 0.0–4.4)
CHOLESTEROL TOTAL: 167 mg/dL (ref 100–199)
HDL: 45 mg/dL (ref >39)
LDL Calculated: 93 mg/dL (ref 0–99)
Triglycerides: 147 mg/dL (ref 0–149)
VLDL Cholesterol Cal: 29 mg/dL (ref 5–40)

## 2018-08-07 LAB — TSH: TSH: 1.53 u[IU]/mL (ref 0.450–4.500)

## 2018-08-07 LAB — HEMOGLOBIN A1C
Est. average glucose Bld gHb Est-mCnc: 108 mg/dL
HEMOGLOBIN A1C: 5.4 % (ref 4.8–5.6)

## 2018-08-07 LAB — GLUCOSE, RANDOM: GLUCOSE: 86 mg/dL (ref 65–99)

## 2018-11-29 ENCOUNTER — Ambulatory Visit: Payer: Medicare Other | Attending: Oncology

## 2018-12-01 ENCOUNTER — Ambulatory Visit
Admission: RE | Admit: 2018-12-01 | Discharge: 2018-12-01 | Disposition: A | Discharge status: Home / Self Care | Payer: Medicare Other | Source: Ambulatory Visit | Attending: Obstetrics and Gynecology | Admitting: Obstetrics and Gynecology

## 2018-12-01 DIAGNOSIS — Z1239 Encounter for other screening for malignant neoplasm of breast: Secondary | ICD-10-CM

## 2018-12-01 DIAGNOSIS — Z1231 Encounter for screening mammogram for malignant neoplasm of breast: Secondary | ICD-10-CM | POA: Diagnosis not present

## 2019-08-05 ENCOUNTER — Encounter: Payer: Medicaid Other | Admitting: Obstetrics and Gynecology

## 2019-08-09 ENCOUNTER — Other Ambulatory Visit: Payer: Self-pay

## 2019-08-09 ENCOUNTER — Encounter: Payer: Self-pay | Admitting: Obstetrics and Gynecology

## 2019-08-09 ENCOUNTER — Ambulatory Visit (INDEPENDENT_AMBULATORY_CARE_PROVIDER_SITE_OTHER): Payer: Medicare Other | Admitting: Obstetrics and Gynecology

## 2019-08-09 VITALS — BP 165/100 | HR 114 | Ht 64.0 in | Wt 154.9 lb

## 2019-08-09 DIAGNOSIS — Z1231 Encounter for screening mammogram for malignant neoplasm of breast: Secondary | ICD-10-CM | POA: Diagnosis not present

## 2019-08-09 DIAGNOSIS — Z01419 Encounter for gynecological examination (general) (routine) without abnormal findings: Secondary | ICD-10-CM | POA: Diagnosis not present

## 2019-08-09 NOTE — Progress Notes (Signed)
HPI:      [name] is a 48 y.o. [id number] who LMP was Patient's last menstrual period was 07/17/2019.  Subjective:   She presents today for her annual examination.  She has no complaints.  She is having normal regular menstrual periods.  She has a tubal ligation for birth control.    Hx: The following portions of the patient's history were reviewed and updated as appropriate:             She  has a past medical history of Anxiety. She does not have any pertinent problems on file. She  has a past surgical history that includes Cesarean section and Tubal ligation. Her family history is not on file. She  reports that she has never smoked. She has never used smokeless tobacco. She reports that she does not drink alcohol or use drugs. She has a current medication list which includes the following prescription(s): amitriptyline and sertraline. She is allergic to codeine.       Review of Systems:  Review of Systems  Constitutional: Denied constitutional symptoms, night sweats, recent illness, fatigue, fever, insomnia and weight loss.  Eyes: Denied eye symptoms, eye pain, photophobia, vision change and visual disturbance.  Ears/Nose/Throat/Neck: Denied ear, nose, throat or neck symptoms, hearing loss, nasal discharge, sinus congestion and sore throat.  Cardiovascular: Denied cardiovascular symptoms, arrhythmia, chest pain/pressure, edema, exercise intolerance, orthopnea and palpitations.  Respiratory: Denied pulmonary symptoms, asthma, pleuritic pain, productive sputum, cough, dyspnea and wheezing.  Gastrointestinal: Denied, gastro-esophageal reflux, melena, nausea and vomiting.  Genitourinary: Denied genitourinary symptoms including symptomatic vaginal discharge, pelvic relaxation issues, and urinary complaints.  Musculoskeletal: Denied musculoskeletal symptoms, stiffness, swelling, muscle weakness and myalgia.  Dermatologic: Denied dermatology symptoms, rash and scar.  Neurologic:  Denied neurology symptoms, dizziness, headache, neck pain and syncope.  Psychiatric: Denied psychiatric symptoms, anxiety and depression.  Endocrine: Denied endocrine symptoms including hot flashes and night sweats.   Meds:   Current Outpatient Medications on File Prior to Visit  Medication Sig Dispense Refill  . amitriptyline (ELAVIL) 25 MG tablet     . sertraline (ZOLOFT) 50 MG tablet Take 50 mg by mouth daily.     No current facility-administered medications on file prior to visit.     Objective:     Vitals:   08/09/19 1342  BP: (!) 165/100  Pulse: (!) 114              Physical examination General NAD, Conversant  HEENT Atraumatic; Op clear with mmm.  Normo-cephalic. Pupils reactive. Anicteric sclerae  Thyroid/Neck Smooth without nodularity or enlargement. Normal ROM.  Neck Supple.  Skin No rashes, lesions or ulceration. Normal palpated skin turgor. No nodularity.  Breasts: No masses or discharge.  Symmetric.  No axillary adenopathy.  Lungs: Clear to auscultation.No rales or wheezes. Normal Respiratory effort, no retractions.  Heart: NSR.  No murmurs or rubs appreciated. No periferal edema  Abdomen: Soft.  Non-tender.  No masses.  No HSM. No hernia  Extremities: Moves all appropriately.  Normal ROM for age. No lymphadenopathy.  Neuro: Oriented to PPT.  Normal mood. Normal affect.     Pelvic:   Vulva: Normal appearance.  No lesions.  Vagina: No lesions or abnormalities noted.  Support: Normal pelvic support.  Urethra No masses tenderness or scarring.  Meatus Normal size without lesions or prolapse.  Cervix: Normal appearance.  No lesions.  Anus: Normal exam.  No lesions.  Perineum: Normal exam.  No lesions.  Bimanual   Uterus: Normal size.  Non-tender.  Mobile.  AV.  Adnexae: No masses.  Non-tender to palpation.  Cul-de-sac: Negative for abnormality.      Assessment:    [id number] Patient Active Problem List   Diagnosis Date Noted  . Elevated blood  pressure reading 08/04/2018  . History of tubal ligation 04/27/2017  . History of 2 cesarean sections 04/27/2017     1. Well woman exam with routine gynecological exam   2. Encounter for screening mammogram for malignant neoplasm of breast       Plan:            1.  Basic Screening Recommendations The basic screening recommendations for asymptomatic women were discussed with the patient during her visit.  The age-appropriate recommendations were discussed with her and the rational for the tests reviewed.  When I am informed by the patient that another primary care physician has previously obtained the age-appropriate tests and they are up-to-date, only outstanding tests are ordered and referrals given as necessary.  Abnormal results of tests will be discussed with her when all of her results are completed. Mammogram ordered- patient to present when fasting for lab work- Pap smear next year. Orders Orders Placed This Encounter  Procedures  . MM 3D SCREEN BREAST BILATERAL  . Hemoglobin A1c  . Lipid panel  . TSH    No orders of the defined types were placed in this encounter.       F/U  Return in about 1 year (around 08/08/2020) for Annual Physical.  Finis Bud, M.D. 08/09/2019 2:13 PM

## 2019-08-09 NOTE — Progress Notes (Signed)
Patient comes in today for annual exam. She has no concerns today. Labs and mammogram ordered.  

## 2019-08-12 ENCOUNTER — Other Ambulatory Visit: Payer: Medicare Other

## 2019-08-12 ENCOUNTER — Other Ambulatory Visit: Payer: Self-pay

## 2019-08-13 LAB — LIPID PANEL
Chol/HDL Ratio: 4.9 ratio — ABNORMAL HIGH (ref 0.0–4.4)
Cholesterol, Total: 194 mg/dL (ref 100–199)
HDL: 40 mg/dL (ref >39)
LDL Chol Calc (NIH): 121 mg/dL — ABNORMAL HIGH (ref 0–99)
Triglycerides: 189 mg/dL — ABNORMAL HIGH (ref 0–149)
VLDL Cholesterol Cal: 33 mg/dL (ref 5–40)

## 2019-08-13 LAB — TSH: TSH: 2.62 u[IU]/mL (ref 0.450–4.500)

## 2019-08-13 LAB — HEMOGLOBIN A1C
Est. average glucose Bld gHb Est-mCnc: 111 mg/dL
Hgb A1c MFr Bld: 5.5 % (ref 4.8–5.6)

## 2019-12-05 ENCOUNTER — Ambulatory Visit
Admission: RE | Admit: 2019-12-05 | Discharge: 2019-12-05 | Disposition: A | Discharge status: Home / Self Care | Payer: Medicare Other | Source: Ambulatory Visit | Attending: Obstetrics and Gynecology | Admitting: Obstetrics and Gynecology

## 2019-12-05 DIAGNOSIS — Z1231 Encounter for screening mammogram for malignant neoplasm of breast: Secondary | ICD-10-CM

## 2020-01-15 IMAGING — MG DIGITAL SCREENING BILATERAL MAMMOGRAM WITH TOMO AND CAD
8 series · 8 of 24 positions shown · non-contrast
Comparison: Previous exam(s).

CLINICAL DATA: Screening.

EXAM:
DIGITAL SCREENING BILATERAL MAMMOGRAM WITH TOMO AND CAD

[R CC synth-2D]
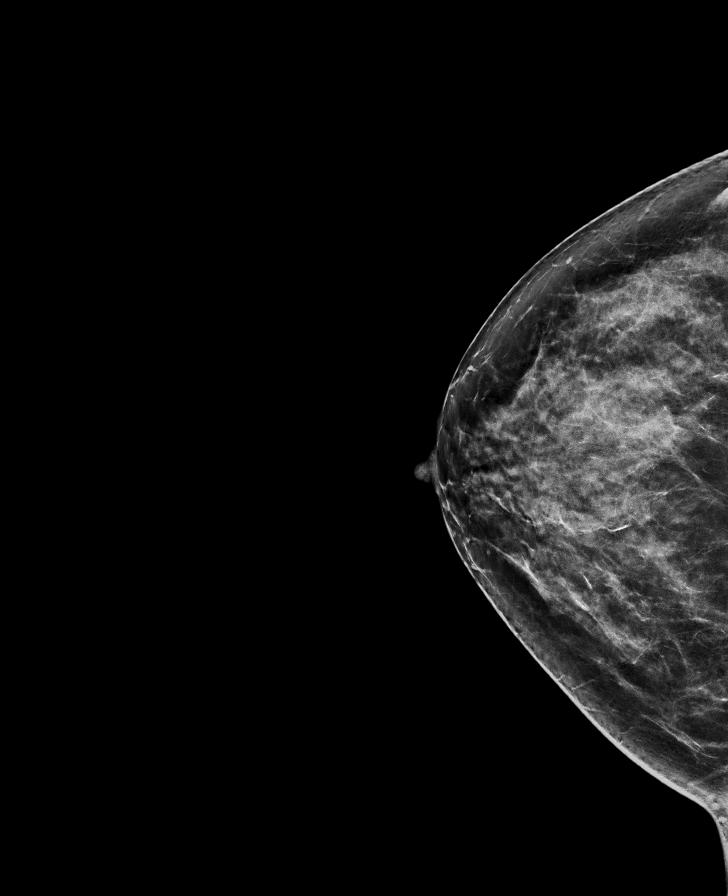

[L CC synth-2D]
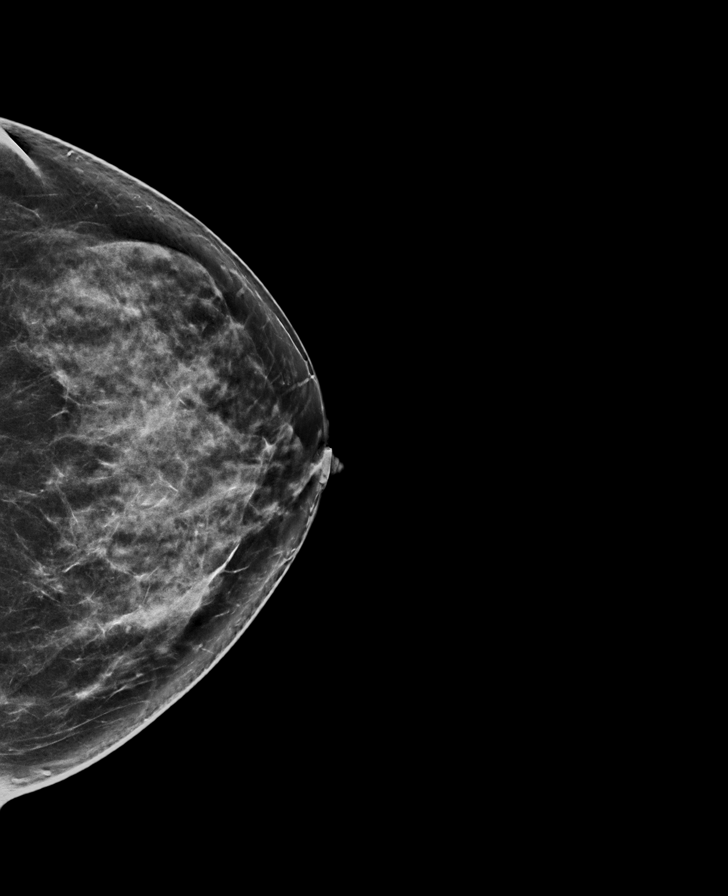

[R MLO synth-2D]
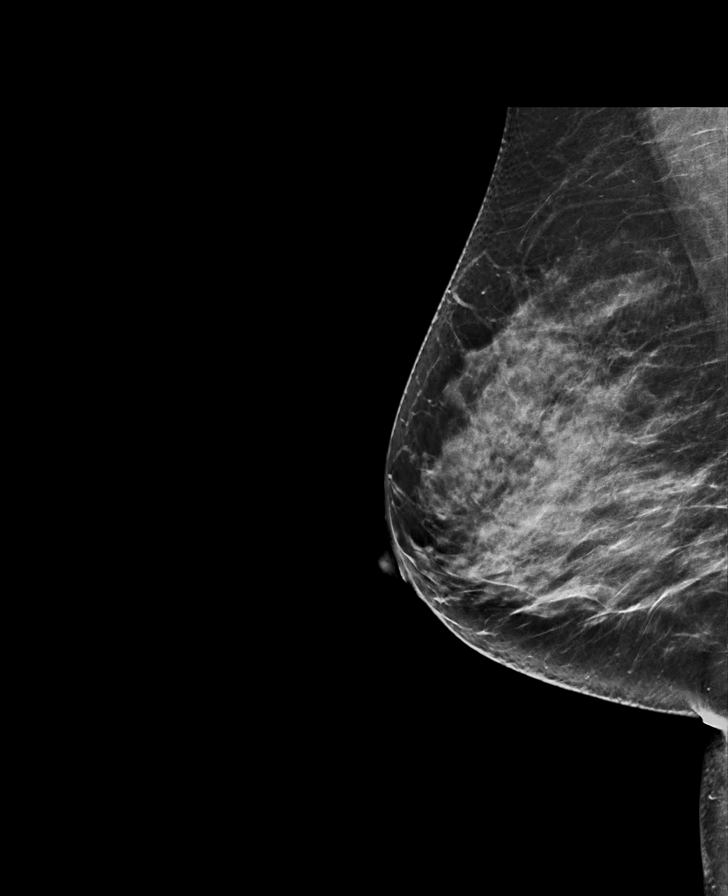

[L MLO synth-2D]
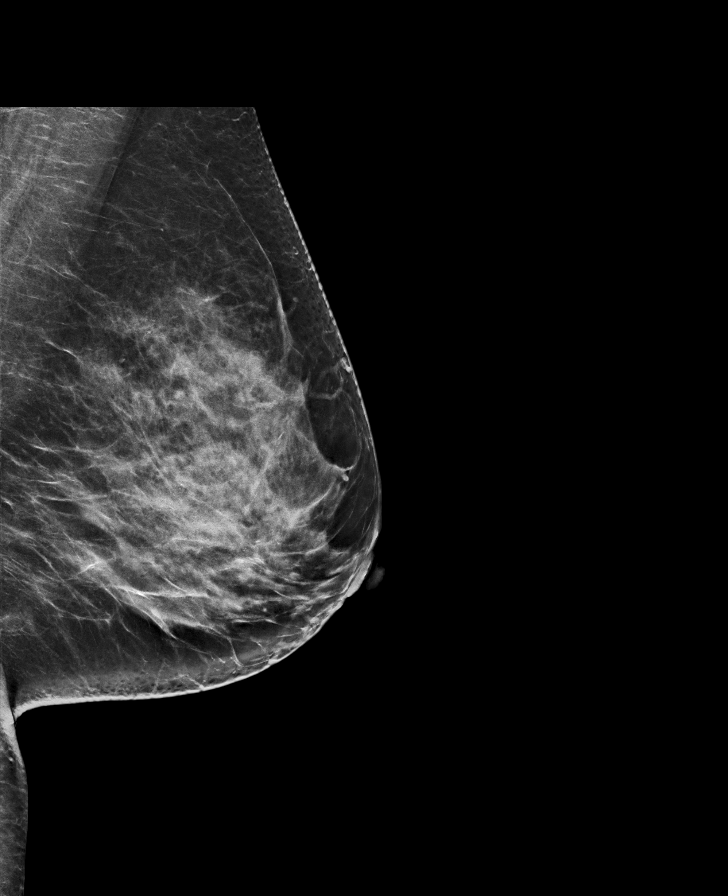

[L MLO tomo · tomo slice 38/75.0]
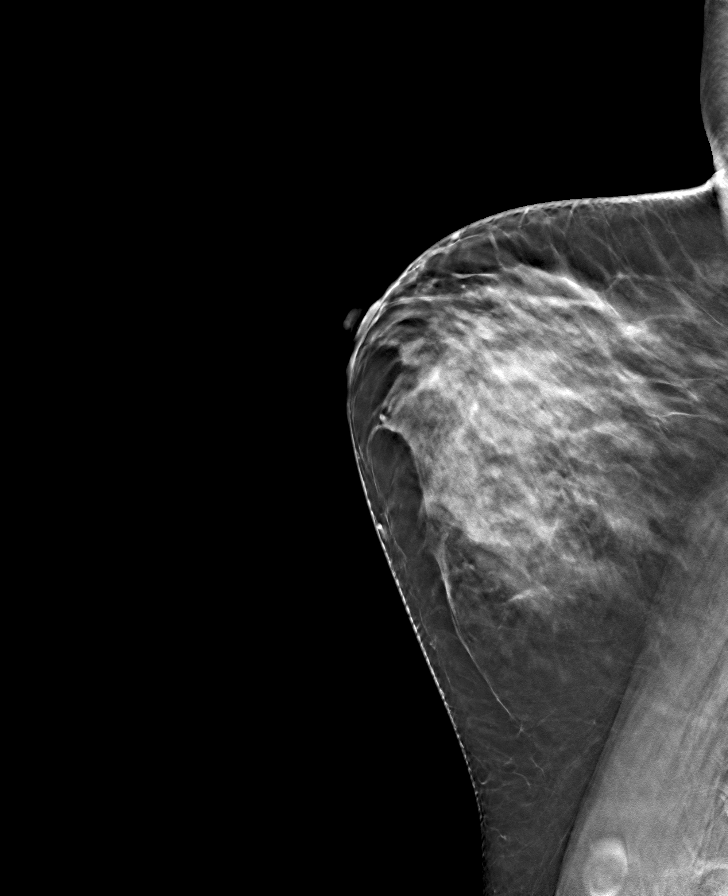

[R CC tomo · tomo slice 37/73.0]
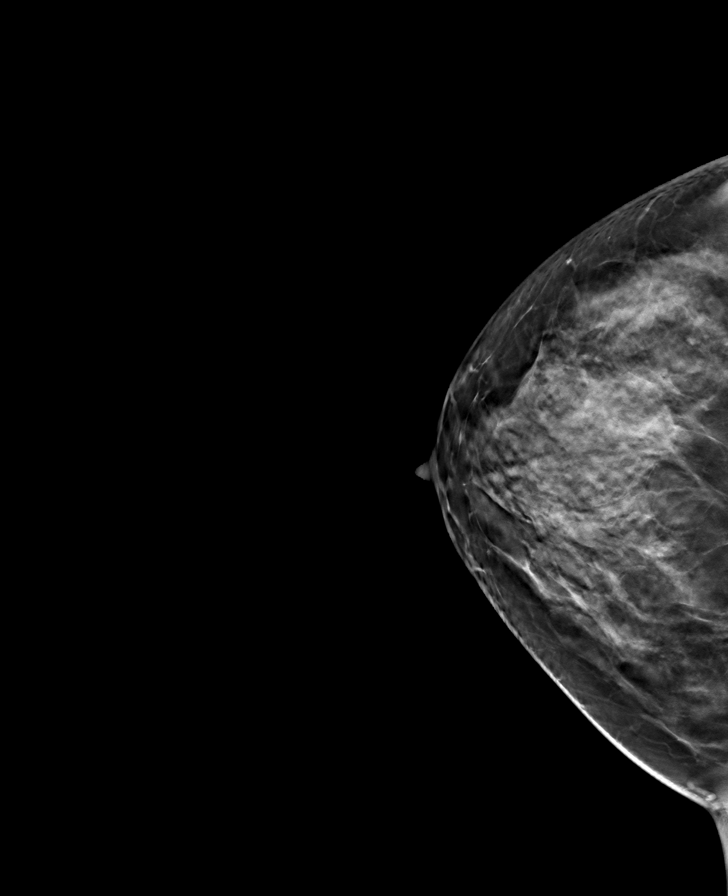

[L CC tomo · tomo slice 39/76.0]
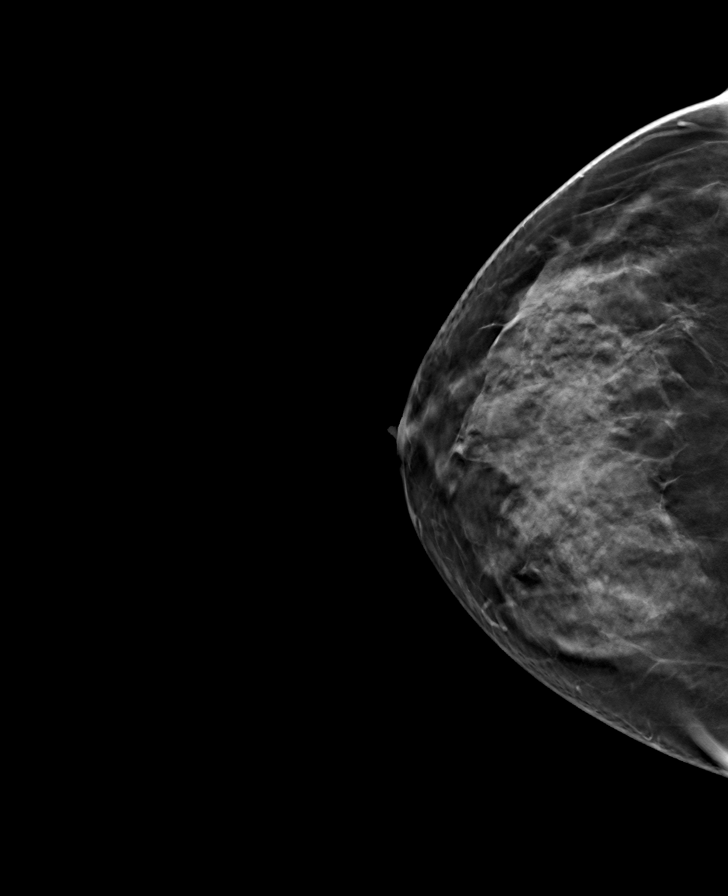

[R MLO tomo · tomo slice 37/74.0]
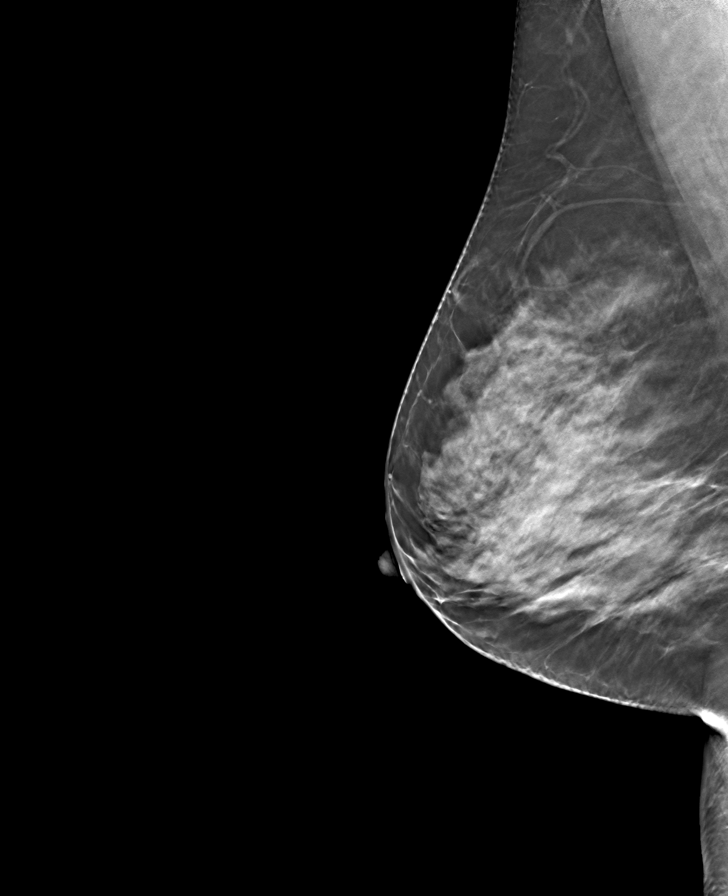

[8 of 24 positions shown; findings below may reference images not displayed]

ACR Breast Density Category c: The breast tissue is heterogeneously
dense, which may obscure small masses.
FINDINGS: There are no findings suspicious for malignancy. Images were
processed with CAD.
IMPRESSION: No mammographic evidence of malignancy. A result letter of this
screening mammogram will be mailed directly to the patient.

RECOMMENDATION:
Screening mammogram in one year. (Code:[id number])

BI-RADS CATEGORY  1: Negative.

## 2020-04-28 ENCOUNTER — Other Ambulatory Visit: Payer: Self-pay

## 2020-04-28 ENCOUNTER — Encounter: Payer: Self-pay | Admitting: Emergency Medicine

## 2020-04-28 ENCOUNTER — Emergency Department
Admission: EM | Admit: 2020-04-28 | Discharge: 2020-04-28 | Disposition: A | Discharge status: Home / Self Care | Payer: Medicare Other | Attending: Emergency Medicine | Admitting: Emergency Medicine

## 2020-04-28 DIAGNOSIS — Y92009 Unspecified place in unspecified non-institutional (private) residence as the place of occurrence of the external cause: Secondary | ICD-10-CM | POA: Insufficient documentation

## 2020-04-28 DIAGNOSIS — I10 Essential (primary) hypertension: Secondary | ICD-10-CM | POA: Diagnosis not present

## 2020-04-28 DIAGNOSIS — L03116 Cellulitis of left lower limb: Secondary | ICD-10-CM

## 2020-04-28 DIAGNOSIS — S80862A Insect bite (nonvenomous), left lower leg, initial encounter: Secondary | ICD-10-CM | POA: Diagnosis present

## 2020-04-28 DIAGNOSIS — Y9389 Activity, other specified: Secondary | ICD-10-CM | POA: Insufficient documentation

## 2020-04-28 DIAGNOSIS — W57XXXA Bitten or stung by nonvenomous insect and other nonvenomous arthropods, initial encounter: Secondary | ICD-10-CM | POA: Diagnosis not present

## 2020-04-28 DIAGNOSIS — Y999 Unspecified external cause status: Secondary | ICD-10-CM | POA: Insufficient documentation

## 2020-04-28 DIAGNOSIS — R21 Rash and other nonspecific skin eruption: Secondary | ICD-10-CM | POA: Diagnosis not present

## 2020-04-28 HISTORY — DX: Essential (primary) hypertension: I10

## 2020-04-28 MED ORDER — CEPHALEXIN 500 MG PO CAPS
500.0000 mg | ORAL_CAPSULE | Freq: Once | ORAL | Status: AC
  Administered 2020-04-28: 500 mg via ORAL
  Filled 2020-04-28: qty 1

## 2020-04-28 MED ORDER — CEPHALEXIN 500 MG PO CAPS: 500.0000 mg | ORAL_CAPSULE | Freq: Four times a day (QID) | ORAL | 0 refills | Status: AC

## 2020-04-28 NOTE — ED Provider Notes (Signed)
[facility] EMERGENCY DEPARTMENT Provider Note   CSN: [id number] Arrival date & time: 04/28/20  1849     History Chief Complaint  Patient presents with  . Insect Bite    Betty Vasquez is a 49 y.o. female presents to the emergency department evaluation of insect bite to the left lower leg x1 week.  She describes erythematous pruritic area.  Erythema has spread from 1 week ago.  No fevers.  No pain swelling or edema throughout the calf.  Erythema is localized.  She has not been taking any antibiotics.  She denies any tick bites.  She is unsure what type of insect bit her. HPI     Past Medical History:  Diagnosis Date  . Anxiety   . Hypertension     Patient Active Problem List   Diagnosis Date Noted  . Elevated blood pressure reading 08/04/2018  . History of tubal ligation 04/27/2017  . History of 2 cesarean sections 04/27/2017    Past Surgical History:  Procedure Laterality Date  . CESAREAN SECTION     2  . TUBAL LIGATION       OB History    Gravida  2   Para  2   Term  2   Preterm      AB      Living  2     SAB      TAB      Ectopic      Multiple      Live Births  2           Family History  Problem Relation Age of Onset  . Breast cancer Neg Hx   . Ovarian cancer Neg Hx   . Colon cancer Neg Hx   . Diabetes Neg Hx     Social History   Tobacco Use  . Smoking status: Never Smoker  . Smokeless tobacco: Never Used  Vaping Use  . Vaping Use: Never used  Substance Use Topics  . Alcohol use: No  . Drug use: No    Home Medications Prior to Admission medications   Medication Sig Start Date End Date Taking? Authorizing Provider  amitriptyline (ELAVIL) 25 MG tablet  07/19/19   [provider]  cephALEXin (KEFLEX) 500 MG capsule Take 1 capsule (500 mg total) by mouth 4 (four) times daily for 10 days. 04/28/20 05/08/20  Evon Slack, PA-C  sertraline (ZOLOFT) 50 MG tablet Take 50 mg by mouth daily.     [provider]    Allergies    Codeine  Review of Systems   Review of Systems  Constitutional: Negative for fever.  Respiratory: Negative for shortness of breath.   Cardiovascular: Negative for chest pain.  Musculoskeletal: Negative for arthralgias and joint swelling.  Skin: Positive for rash.  Neurological: Negative for dizziness and headaches.    Physical Exam Updated Vital Signs BP (!) 192/98 (BP Location: Left Arm)   Pulse 98   Temp 98.5 F (36.9 C)   Resp 18   Ht 5\' 4"  (1.626 m)   Wt 72.6 kg   LMP 04/15/2020 (Exact Date)   SpO2 100%   BMI 27.46 kg/m   Physical Exam Constitutional:      Appearance: Normal appearance. She is well-developed.  HENT:     Head: Normocephalic and atraumatic.  Eyes:     Conjunctiva/sclera: Conjunctivae normal.  Cardiovascular:     Rate and Rhythm: Normal rate.  Pulmonary:     Effort: Pulmonary  effort is normal. No respiratory distress.  Musculoskeletal:     Cervical back: Normal range of motion.     Comments: Left lower extremity with no swelling or edema.  No tenderness to palpation.  She has a focal area of mild warmth and erythema, circumferential along the inner distal lower leg.  Area of erythema measures 4 cm in diameter.  No fluctuance or induration.  Sensation intact distally.  Skin:    General: Skin is warm.     Findings: No rash.  Neurological:     Mental Status: She is alert and oriented to person, place, and time.  Psychiatric:        Behavior: Behavior normal.        Thought Content: Thought content normal.     ED Results / Procedures / Treatments   Labs (all labs ordered are listed, but only abnormal results are displayed) Labs Reviewed - No data to display  EKG None  Radiology No results found.  Procedures Procedures (including critical care time)  Medications Ordered in ED Medications  cephALEXin (KEFLEX) capsule 500 mg (500 mg Oral Given 04/28/20 1952)    ED Course  I have reviewed the  triage vital signs and the nursing notes.  Pertinent labs & imaging results that were available during my care of the patient were reviewed by me and considered in my medical decision making (see chart for details).    MDM Rules/Calculators/A&P                          49 year old female with left leg cellulitis, no signs of DVT.  No fluctuance.  Patient is started on cephalexin.  She understands signs symptoms return to ED for. Final Clinical Impression(s) / ED Diagnoses Final diagnoses:  Insect bite of left lower leg, initial encounter  Cellulitis of left leg    Rx / DC Orders ED Discharge Orders         Ordered    cephALEXin (KEFLEX) 500 MG capsule  4 times daily     Discontinue  Reprint     04/28/20 1953           Renata Caprice 04/28/20 Chevis Pretty, MD 04/29/20 [phone]

## 2020-04-28 NOTE — ED Notes (Signed)
Pt triaged with use of onsite interpreter.

## 2020-04-28 NOTE — Discharge Instructions (Addendum)
Please take antibiotics as prescribed.  If any increasing pain swelling warmth redness or fevers return to the ER.

## 2020-04-28 NOTE — ED Triage Notes (Signed)
Pt arrives POV to triage with c/o insect bite to lower left leg about 1 week ago. Pt has what appears to be an infected area which is localized to the area. Pt is in NAD.

## 2020-08-10 ENCOUNTER — Encounter: Payer: Medicare Other | Admitting: Obstetrics and Gynecology

## 2020-08-14 ENCOUNTER — Encounter: Payer: Medicare Other | Admitting: Obstetrics and Gynecology

## 2020-08-21 ENCOUNTER — Encounter: Payer: Self-pay | Admitting: Obstetrics and Gynecology

## 2020-08-21 ENCOUNTER — Other Ambulatory Visit (HOSPITAL_COMMUNITY)
Admission: RE | Admit: 2020-08-21 | Discharge: 2020-08-21 | Disposition: A | Discharge status: Home / Self Care | Payer: Medicare Other | Source: Ambulatory Visit | Attending: Obstetrics and Gynecology | Admitting: Obstetrics and Gynecology

## 2020-08-21 ENCOUNTER — Other Ambulatory Visit: Payer: Self-pay

## 2020-08-21 ENCOUNTER — Ambulatory Visit (INDEPENDENT_AMBULATORY_CARE_PROVIDER_SITE_OTHER): Payer: Medicare Other | Admitting: Obstetrics and Gynecology

## 2020-08-21 VITALS — BP 148/95 | HR 116 | Ht 64.0 in | Wt 159.8 lb

## 2020-08-21 DIAGNOSIS — Z124 Encounter for screening for malignant neoplasm of cervix: Secondary | ICD-10-CM | POA: Diagnosis not present

## 2020-08-21 DIAGNOSIS — Z1151 Encounter for screening for human papillomavirus (HPV): Secondary | ICD-10-CM | POA: Diagnosis not present

## 2020-08-21 DIAGNOSIS — Z01419 Encounter for gynecological examination (general) (routine) without abnormal findings: Secondary | ICD-10-CM

## 2020-08-21 DIAGNOSIS — Z1231 Encounter for screening mammogram for malignant neoplasm of breast: Secondary | ICD-10-CM | POA: Diagnosis not present

## 2020-08-21 NOTE — Progress Notes (Signed)
New Pt present to est care. Pt declined flu vaccine, pap due, mammogram due orders placed.  Pt stated that she was doing well.

## 2020-08-21 NOTE — Progress Notes (Signed)
HPI:      Betty Vasquez is a 49 y.o. [id number] who LMP was Patient's last menstrual period was 08/05/2020.  Subjective:   She presents today for her annual examination.  She has no complaints.  She continues to have normal monthly menses.  She has a tubal ligation for birth control.    Hx: The following portions of the patient's history were reviewed and updated as appropriate:             She  has a past medical history of Anxiety and Hypertension. She does not have any pertinent problems on file. She  has a past surgical history that includes Cesarean section and Tubal ligation. Her family history includes Hypertension in her mother. She  reports that she has never smoked. She has never used smokeless tobacco. She reports that she does not drink alcohol and does not use drugs. She has a current medication list which includes the following prescription(s): amitriptyline, amlodipine, and sertraline. She is allergic to codeine.       Review of Systems:  Review of Systems  Constitutional: Denied constitutional symptoms, night sweats, recent illness, fatigue, fever, insomnia and weight loss.  Eyes: Denied eye symptoms, eye pain, photophobia, vision change and visual disturbance.  Ears/Nose/Throat/Neck: Denied ear, nose, throat or neck symptoms, hearing loss, nasal discharge, sinus congestion and sore throat.  Cardiovascular: Denied cardiovascular symptoms, arrhythmia, chest pain/pressure, edema, exercise intolerance, orthopnea and palpitations.  Respiratory: Denied pulmonary symptoms, asthma, pleuritic pain, productive sputum, cough, dyspnea and wheezing.  Gastrointestinal: Denied, gastro-esophageal reflux, melena, nausea and vomiting.  Genitourinary: Denied genitourinary symptoms including symptomatic vaginal discharge, pelvic relaxation issues, and urinary complaints.  Musculoskeletal: Denied musculoskeletal symptoms, stiffness, swelling, muscle weakness and myalgia.  Dermatologic:  Denied dermatology symptoms, rash and scar.  Neurologic: Denied neurology symptoms, dizziness, headache, neck pain and syncope.  Psychiatric: Denied psychiatric symptoms, anxiety and depression.  Endocrine: Denied endocrine symptoms including hot flashes and night sweats.   Meds:   Current Outpatient Medications on File Prior to Visit  Medication Sig Dispense Refill  . amitriptyline (ELAVIL) 25 MG tablet     . amLODipine (NORVASC) 5 MG tablet Take 5 mg by mouth daily. Take 2 tablets by mouth daily    . sertraline (ZOLOFT) 50 MG tablet Take 50 mg by mouth daily.     No current facility-administered medications on file prior to visit.          Objective:     Vitals:   08/21/20 0903  BP: (!) 148/95  Pulse: (!) 116    Filed Weights   08/21/20 0903  Weight: 159 lb 12.8 oz (72.5 kg)              Physical examination General NAD, Conversant  HEENT Atraumatic; Op clear with mmm.  Normo-cephalic. Pupils reactive. Anicteric sclerae  Thyroid/Neck Smooth without nodularity or enlargement. Normal ROM.  Neck Supple.  Skin No rashes, lesions or ulceration. Normal palpated skin turgor. No nodularity.  Breasts: No masses or discharge.  Symmetric.  No axillary adenopathy.  Lungs: Clear to auscultation.No rales or wheezes. Normal Respiratory effort, no retractions.  Heart: NSR.  No murmurs or rubs appreciated. No periferal edema  Abdomen: Soft.  Non-tender.  No masses.  No HSM. No hernia  Extremities: Moves all appropriately.  Normal ROM for age. No lymphadenopathy.  Neuro: Oriented to PPT.  Normal mood. Normal affect.     Pelvic:   Vulva: Normal appearance.  No lesions.  Vagina: No lesions  or abnormalities noted.  Support: Normal pelvic support.  Urethra No masses tenderness or scarring.  Meatus Normal size without lesions or prolapse.  Cervix: Normal appearance.  No lesions.  Anus: Normal exam.  No lesions.  Perineum: Normal exam.  No lesions.        Bimanual   Uterus: Normal  size.  Non-tender.  Mobile.  AV.  Adnexae: No masses.  Non-tender to palpation.  Cul-de-sac: Negative for abnormality.      Assessment:    [id number] Patient Active Problem List   Diagnosis Date Noted  . Elevated blood pressure reading 08/04/2018  . History of tubal ligation 04/27/2017  . History of 2 cesarean sections 04/27/2017     1. Encounter for well woman exam with routine gynecological exam   2. Pap smear for cervical cancer screening   3. Breast cancer screening by mammogram     Normal exam patient without complaint.   Plan:            1.  Basic Screening Recommendations The basic screening recommendations for asymptomatic women were discussed with the patient during her visit.  The age-appropriate recommendations were discussed with her and the rational for the tests reviewed.  When I am informed by the patient that another primary care physician has previously obtained the age-appropriate tests and they are up-to-date, only outstanding tests are ordered and referrals given as necessary.  Abnormal results of tests will be discussed with her when all of her results are completed.  Routine preventative health maintenance measures emphasized: Exercise/Diet/Weight control, Tobacco Warnings, Alcohol/Substance use risks and Stress Management Pap performed-mammogram ordered-blood work today. Orders Orders Placed This Encounter  Procedures  . MM 3D SCREEN BREAST BILATERAL  . TSH  . Lipid panel  . Hemoglobin A1c    No orders of the defined types were placed in this encounter.           F/U  No follow-ups on file.  Elonda Husky, M.D. 08/21/2020 9:44 AM

## 2020-08-21 NOTE — Patient Instructions (Signed)
Cuidados preventivos en las mujeres de 47 a 76 aos de edad Preventive Care 47-47 Years Old, Female Los cuidados preventivos hacen referencia a las opciones en cuanto a las visitas al mdico y al estilo de vida, las cuales pueden promover la salud y Musician. Esto puede comprender lo siguiente:  Un examen fsico anual. Esto tambin se puede llamar control de bienestar anual.  Visitas regulares al dentista y exmenes oculares.  Vacunas.  Estudios para Engineer, building services.  Opciones saludables de estilo de vida, como seguir una dieta saludable, hacer ejercicio regularmente, no usar drogas ni productos que contengan nicotina y tabaco, y limitar el consumo de alcohol. Qu puedo esperar para mi visita de cuidado preventivo? Examen fsico El mdico revisar lo siguiente:  IT consultant y [place]. Esto se puede usar para calcular el ndice de masa corporal ([place]), que indica si tiene un peso saludable.  Frecuencia cardaca y presin arterial.  Piel para detectar manchas anormales. Asesoramiento Su mdico puede preguntarle acerca de:  Consumo de tabaco, alcohol y drogas.  Su bienestar emocional.  El bienestar en el hogar y sus relaciones personales.  Su actividad sexual.  Sus hbitos de alimentacin.  Su trabajo y [place] laboral.  Mtodos anticonceptivos.  Su ciclo menstrual.  Sus antecedentes de Media planner. Qu vacunas necesito?  [place] antigripal  Se recomienda aplicarse esta vacuna todos los Rudy. Vacuna contra el ttanos, difteria y tos ferina (Tdap)  Es posible que tenga que aplicarse un refuerzo contra el ttanos y la difteria (DT) cada 10aos. Vacuna contra la varicela  Es posible que tenga que aplicrsela si no recibi esta vacuna. Vacuna contra el herpes zster (culebrilla)  Es posible que la necesite despus de los 52 aos de [place]. Vacuna contra el sarampin, rubola y paperas (SRP)  Es posible que necesite aplicarse al menos una dosis de la vacuna  SRP si naci despus de [phone]. Podra tambin necesitar una segunda dosis. Vacuna antineumoccica conjugada (PCV13)  Puede necesitar esta vacuna si tiene determinadas enfermedades y no se vacun anteriormente. [place] antineumoccica de polisacridos (PPSV23)  Quizs tenga que aplicarse una o dos dosis si fuma o si tiene determinadas afecciones. Edward Jolly antimeningoccica conjugada (MenACWY)  Puede necesitar esta vacuna si tiene determinadas afecciones. Vacuna contra la hepatitis A  Es posible que necesite esta vacuna si tiene ciertas afecciones o si viaja o trabaja en lugares en los que podra estar expuesta a la hepatitis A. Vacuna contra la hepatitis B  Es posible que necesite esta vacuna si tiene ciertas afecciones o si viaja o trabaja en lugares en los que podra estar expuesta a la hepatitis B. Vacuna antihaemophilus influenzae tipo B (Hib)  Puede necesitar esta vacuna si tiene determinadas afecciones. Vacuna contra el virus del papiloma humano (VPH)  Si el mdico se lo recomienda, Research scientist (physical sciences) tres dosis a lo largo de 6 meses. Puede recibir las vacunas en forma de dosis individuales o en forma de dos o ms vacunas juntas en la misma inyeccin (vacunas combinadas). Hable con su mdico [employer] y beneficios de las vacunas combinadas. Qu pruebas necesito? Anlisis de [employer] de lpidos y colesterol. Estos se pueden verificar cada 5 aos o, con ms frecuencia, si usted tiene ms de 42 aos de edad.  Anlisis de hepatitisC.  Anlisis de hepatitisB. Pruebas de deteccin  Pruebas de deteccin de cncer de pulmn. Es posible que se le realice esta prueba de deteccin a partir de los 2 aos de edad, si ha fumado durante 30 aos  un paquete diario y sigue fumando o dej el hbito en algn momento en los ltimos 15 aos.  Pruebas de Programme researcher, broadcasting/film/video de Surveyor, minerals. Todos los adultos a partir de los 47 aos de edad y [place] 18 aos de edad deben hacerse esta  prueba de deteccin. El mdico puede recomendarle las pruebas de deteccin a partir de los 47 aos de edad si corre un mayor riesgo. Le realizarn pruebas cada 1 a 10 aos, segn los [place] y el tipo de prueba de Programme researcher, broadcasting/film/video.  Pruebas de deteccin de la diabetes. Esto se Set designer un control del azcar en la sangre (glucosa) despus de no haber comido durante un periodo de tiempo (ayuno). Es posible que se le realice esta prueba cada 1 a 3 aos.  Mamografa. Se puede realizar cada 1 o 2 aos. Hable con su mdico sobre cundo debe comenzar a Engineer, manufacturing de [place] regular. Esto depende de si tiene antecedentes familiares de cncer de mama o no.  Pruebas de deteccin de cncer relacionado con las mutaciones del BRCA. Es posible que se las deba realizar si tiene antecedentes de cncer de mama, de ovario, de trompas o peritoneal.  Examen plvico y prueba de Papanicolaou. Esto se puede realizar cada 8aos a Renato Gails de los 47aos de edad. A partir de los 30 aos, esto se puede Optometrist cada 5 aos si usted se realiza una prueba de Papanicolaou en combinacin con una prueba de deteccin del virus del papiloma humano (VPH). Otras pruebas  Anlisis de enfermedades de transmisin sexual (ETS).  Densitometra sea. Esto se realiza para detectar osteoporosis. Se le puede realizar este examen de deteccin si tiene un riesgo alto de tener osteoporosis. Siga estas instrucciones en su casa: Comida y bebida  Siga una dieta que incluya frutas frescas y verduras, cereales integrales, lcteos descremados y protenas magras.  Tome los suplementos vitamnicos y [employer] se lo haya indicado el mdico.  No beba alcohol si: ? Su mdico le indica no hacerlo. ? Est embarazada, puede estar embarazada o est tratando de quedar embarazada.  Si bebe alcohol: ? Limite la cantidad que consume de 0 a 1 medida por da. ? Est atenta a la cantidad de alcohol que hay en las bebidas que toma. En los  [place], una medida equivale a una botella de cerveza de 12oz (331m), un vaso de vino de 5oz (1458m o un vaso de una bebida alcohlica de alta graduacin de 1oz (4469m Estilo de vidNavistar International Corporationlas encas a diario.  Mantngase activa. Haga al menos 4m28mos de ejercicio 5o ms das Hilton Hotelso consuma ningn producto que contenga nicotina o tabaco, como cigarrillos, cigarrillos electrnicos y tabaco de mascHigher education careers adviser necesita ayuda para dejar de fumar, consulte al mdico.  Si es sexualmente activa, practique sexo seguro. Use un condn u otra forma de mtodo anticonceptivo (anticonceptivos) a fin de evitEnvironmental health practitionerTS (infecciones de transmisin sexual).  Si el mdico se lo indic, tome una dosis baja de aspirina diariamente a partir de los 50 a70 de edadOak Hillsndo volver?  Visite al mdico una vez al ao para una visita de control.  Pregntele al mdico con qu frecuencia debe realizarse un control de la vista y los dientes.  Mantenga su esquema de vacunacin al da. Esta informacin no tiene comoMarine scientistconsejo del mdico. Asegrese de hacerle al mdico cualquier pregunta que tenga. Document Revised: 08/05/2018 Document Reviewed: 08/05/2018 Elsevier Patient Education  2020  [place] de [employer] Breast Self-Awareness El autoexamen de mamas es para Pharmacist, community apariencia y la sensibilidad de las [place]. Es importante autoexaminarse las [place]. Permite detectar un problema en las mamas a tiempo mientras todava es pequeo y puede tratarse. Todas las mujeres deben autoexaminarse las [place], incluso aquellas que se sometieron a implantes mamarios. Informe al mdico si advierte un cambio en las mamas. Lo que necesita:  Un espejo.  Una habitacin bien iluminada. Cmo realizar el autoexamen de mamas El autoexamen de [place] es una forma de aprender qu es normal para sus mamas y revisar si hay cambios. Para hacer un autoexamen de las  mamas: Busque cambios  1. Qutese toda la ropa por encima de la cintura. 2. Prese frente a un espejo en una habitacin con buena iluminacin. 3. Apoye las manos en las caderas. 4. Empuje hacia abajo con las manos. [place] y los pezones en el espejo para ver si una mama o un pezn se ve diferente del otro. Durante el examen, intente determinar si: ? La forma de una mama es diferente. ? El tamao de [place] mama es [place]. ? Hay arrugas, depresiones y protuberancias en [place] y no en la otra. 6. Observe cada mama para buscar cambios en la piel, por ejemplo: ? Enrojecimiento. ? Zonas escamosas. 7. Observe si hay cambios en los pezones, por ejemplo: ? Lquido alrededor [employer]. ? [place]. ? Hoyuelos. ? Enrojecimiento. ? Un cambio en el lugar de los pezones. [place] si hay cambios  1. Acustese en el piso boca arriba. 2. Plpese cada mama. Para hacerlo, siga estos pasos: ? Elija una mama para palpar. ? Coloque el brazo ms cercano a esa mama por encima de la cabeza. ? Use el otro brazo para palpar la zona del pezn de la mama. Plpese la zona con las yemas de los tres dedos del medio y haga crculos con los dedos. Con el primer crculo, presione suavemente. Con el segundo, ms fuerte. Con el tercero, an ms fuerte. ? Siga haciendo crculos con los dedos con las diferentes presiones a medida que desciende por la mama. Detngase cuando sienta las costillas. ? Desplace los dedos un poco hacia el centro del cuerpo. ? Empiece a hacer crculos con los dedos nuevamente y esta vez haga movimientos ascendentes hasta llegar a la clavcula. ? Siga haciendo crculos [place] y [place] llegar a la axila. Recuerde hacerlos con las tres presiones. ? Plpese la otra mama de la misma forma. 3. Sintese o prese en la ducha o la baera. 4. Con agua jabonosa en la piel, plpese cada mama del mismo modo que lo hizo en el paso2 mientras estaba acostada en el piso. Anote  sus hallazgos Anotar lo que encuentra puede ayudarla a recordar qu contarle al mdico. [place] los siguientes datos:  Qu es normal para cada mama.  Cualquier cambio que encuentre en cada mama, por ejemplo: ? La clase de cambios que encuentra. ? Si tiene dolor. ? Si hay bultos, su tamao y [place].  Cundo tuvo su ltimo perodo menstrual. Consejos generales  [facility].  Si est amamantando, el mejor momento para el examen de las mamas es despus de darle de Administrator, arts al beb o despus de usar un sacaleches.  Si tiene perodos menstruales, el mejor momento para hacerlo es de 5 a 7das despus de la finalizado el perodo menstrual.  Con el tiempo, se sentir cmoda con el autoexamen y Medical laboratory scientific officer  a saber si hay cambios en sus mamas. Comunquese con un mdico si:  Observa un cambio en la forma o el tamao de las mamas o los pezones.  Observa un cambio en la piel de las mamas o los pezones, como la piel enrojecida o escamosa.  Tiene secrecin de lquido proveniente de los pezones que no es normal.  [place] un ndulo o una zona engrosada que no tena antes.  Tiene dolor en las [place].  Tiene alguna inquietud [employer] de la [place]. Resumen  El autoexamen de mamas incluye buscar cambios en las [place], y tambin palpar para Hydrographic surveyor cualquier cambio en las [place].  El autoexamen de mamas debe hacerse frente a un espejo en una habitacin bien iluminada.  Debe revisarse las [employer]. Si tiene perodos menstruales, el mejor momento para hacerlo es de 5 a 7das despus de la finalizado el perodo menstrual.  Informe al mdico si ve cambios en las mamas, como cambios en el tamao, cambios en la piel, Social research officer, government o sensibilidad, o un lquido inusual que sale de los pezones. Esta informacin no tiene Marine scientist el consejo del mdico. Asegrese de hacerle al mdico cualquier pregunta que tenga. Document Revised: 07/20/2018 Document Reviewed:  07/20/2018 Elsevier Patient Education  [place].

## 2020-08-22 LAB — LIPID PANEL
Chol/HDL Ratio: 5.3 ratio — ABNORMAL HIGH (ref 0.0–4.4)
Cholesterol, Total: 256 mg/dL — ABNORMAL HIGH (ref 100–199)
HDL: 48 mg/dL (ref >39)
LDL Chol Calc (NIH): 160 mg/dL — ABNORMAL HIGH (ref 0–99)
Triglycerides: 261 mg/dL — ABNORMAL HIGH (ref 0–149)
VLDL Cholesterol Cal: 48 mg/dL — ABNORMAL HIGH (ref 5–40)

## 2020-08-22 LAB — HEMOGLOBIN A1C
Est. average glucose Bld gHb Est-mCnc: 114 mg/dL
Hgb A1c MFr Bld: 5.6 % (ref 4.8–5.6)

## 2020-08-22 LAB — TSH: TSH: 1.34 u[IU]/mL (ref 0.450–4.500)

## 2020-08-23 LAB — CYTOLOGY - PAP
Adequacy: ABSENT
Comment: NEGATIVE
Diagnosis: NEGATIVE
High risk HPV: NEGATIVE

## 2020-11-27 ENCOUNTER — Other Ambulatory Visit: Payer: Self-pay | Admitting: Obstetrics and Gynecology

## 2020-11-27 DIAGNOSIS — Z1231 Encounter for screening mammogram for malignant neoplasm of breast: Secondary | ICD-10-CM

## 2021-03-20 ENCOUNTER — Other Ambulatory Visit: Payer: Self-pay

## 2021-03-20 ENCOUNTER — Encounter: Payer: Self-pay | Admitting: Obstetrics and Gynecology

## 2021-03-20 ENCOUNTER — Ambulatory Visit (INDEPENDENT_AMBULATORY_CARE_PROVIDER_SITE_OTHER): Payer: BC Managed Care – PPO | Admitting: Obstetrics and Gynecology

## 2021-03-20 VITALS — BP 148/88 | HR 108 | Ht 64.0 in | Wt 151.5 lb

## 2021-03-20 DIAGNOSIS — R0789 Other chest pain: Secondary | ICD-10-CM

## 2021-03-20 DIAGNOSIS — N644 Mastodynia: Secondary | ICD-10-CM | POA: Diagnosis not present

## 2021-03-20 NOTE — Progress Notes (Signed)
HPI:      [name] is a 50 y.o. [id number] who LMP was Patient's last menstrual period was 03/15/2021 (exact date).  Subjective:   She presents today stating that sometime just after her mammogram in March she began to have pain in her right breast and between her shoulder blades in the back.  She denies breast discharge.  She denies trauma to the breast.  Her mammogram in March was noted to be normal. She has had 2 menstrual periods since that time. 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    Hx: The following portions of the patient's history were reviewed and updated as appropriate:             She  has a past medical history of Anxiety and Hypertension. She does not have any pertinent problems on file. She  has a past surgical history that includes Cesarean section and Tubal ligation. Her family history includes Hypertension in her mother. She  reports that she has never smoked. She has never used smokeless tobacco. She reports that she does not drink alcohol and does not use drugs. She has a current medication list which includes the following prescription(s): amitriptyline, amlodipine, and sertraline. She is allergic to codeine.       Review of Systems:  Review of Systems  Constitutional: Denied constitutional symptoms, night sweats, recent illness, fatigue, fever, insomnia and weight loss.  Eyes: Denied eye symptoms, eye pain, photophobia, vision change and visual disturbance.  Ears/Nose/Throat/Neck: Denied ear, nose, throat or neck symptoms, hearing loss, nasal discharge, sinus congestion and sore throat.  Cardiovascular: Denied cardiovascular symptoms, arrhythmia, chest pain/pressure, edema, exercise intolerance, orthopnea and palpitations.  Respiratory: Denied pulmonary symptoms, asthma, pleuritic pain, productive sputum,  cough, dyspnea and wheezing.  Gastrointestinal: Denied, gastro-esophageal reflux, melena, nausea and vomiting.  Genitourinary: See HPI for additional information.  Musculoskeletal: Denied musculoskeletal symptoms, stiffness, swelling, muscle weakness and myalgia.  Dermatologic: Denied dermatology symptoms, rash and scar.  Neurologic: Denied neurology symptoms, dizziness, headache, neck pain and syncope.  Psychiatric: Denied psychiatric symptoms, anxiety and depression.  Endocrine: Denied endocrine symptoms including hot flashes and night sweats.   Meds:   Current Outpatient Medications on File Prior to Visit  Medication Sig Dispense Refill  . amitriptyline (ELAVIL) 25 MG tablet     . amLODipine (NORVASC) 5 MG tablet Take 5 mg by mouth daily. Take 2 tablets by mouth daily    . sertraline (ZOLOFT) 50 MG tablet Take 50 mg by mouth daily.     No current facility-administered medications on file prior to visit.          Objective:     Vitals:   03/20/21 1049  BP: (!) 148/88  Pulse: (!) 108   Filed Weights   03/20/21 1049  Weight: 151 lb 8 oz (68.7 kg)              Examination of both breasts reveals no dominant masses, no axillary adenopathy, no nipple discharge.  The pain that the patient describes on her right side does not localize to breast tissue.  Seems to be much more significant as a part of the chest wall/musculoskeletal system.   Assessment:    [id number] Patient Active Problem List   Diagnosis Date Noted  . Elevated blood pressure reading 08/04/2018  . History of tubal ligation 04/27/2017  . History  of 2 cesarean sections 04/27/2017     1. Breast pain   2. Chest wall pain     Patient likely having chest wall pain not breast pain.  This is also consistent with upper back pain which the patient associates with heavy lifting of a family member while she was acting as caregiver. I think it is very unlikely this represents any type of breast mass and especially  not breast cancer.   Plan:            1.  I have reassured her regarding her pain.  It is likely chest wall pain musculoskeletal.  2.  We have discussed future mammograms and not compressing the breast as much.  3.  Use of NSAID S and rest discussed. Orders No orders of the defined types were placed in this encounter.   No orders of the defined types were placed in this encounter.     F/U  Return for Annual Physical. I spent 25 minutes involved in the care of this patient preparing to see the patient by obtaining and reviewing her medical history (including labs, imaging tests and prior procedures), documenting clinical information in the electronic health record (EHR), counseling and coordinating care plans, writing and sending prescriptions, ordering tests or procedures and directly communicating with the patient by discussing pertinent items from her history and physical exam as well as detailing my assessment and plan as noted above so that she has an informed understanding.  All of her questions were answered.  Elonda Husky, M.D. 03/20/2021 11:16 AM

## 2021-04-26 ENCOUNTER — Other Ambulatory Visit: Payer: Self-pay

## 2021-04-26 ENCOUNTER — Emergency Department: Payer: BLUE CROSS/BLUE SHIELD

## 2021-04-26 ENCOUNTER — Emergency Department
Admission: EM | Admit: 2021-04-26 | Discharge: 2021-04-26 | Disposition: A | Discharge status: Home / Self Care | Payer: BLUE CROSS/BLUE SHIELD | Attending: Emergency Medicine | Admitting: Emergency Medicine

## 2021-04-26 ENCOUNTER — Encounter: Payer: Self-pay | Admitting: Radiology

## 2021-04-26 DIAGNOSIS — Z20822 Contact with and (suspected) exposure to covid-19: Secondary | ICD-10-CM | POA: Diagnosis not present

## 2021-04-26 DIAGNOSIS — B349 Viral infection, unspecified: Secondary | ICD-10-CM | POA: Insufficient documentation

## 2021-04-26 DIAGNOSIS — R0789 Other chest pain: Secondary | ICD-10-CM | POA: Diagnosis not present

## 2021-04-26 DIAGNOSIS — I10 Essential (primary) hypertension: Secondary | ICD-10-CM | POA: Insufficient documentation

## 2021-04-26 DIAGNOSIS — Z79899 Other long term (current) drug therapy: Secondary | ICD-10-CM | POA: Diagnosis not present

## 2021-04-26 DIAGNOSIS — R059 Cough, unspecified: Secondary | ICD-10-CM | POA: Diagnosis present

## 2021-04-26 LAB — COMPREHENSIVE METABOLIC PANEL
ALT: 17 U/L (ref 0–44)
AST: 23 U/L (ref 15–41)
Albumin: 4.6 g/dL (ref 3.5–5.0)
Alkaline Phosphatase: 44 U/L (ref 38–126)
Anion gap: 8 (ref 5–15)
BUN: 12 mg/dL (ref 6–20)
CO2: 25 mmol/L (ref 22–32)
Calcium: 9.4 mg/dL (ref 8.9–10.3)
Chloride: 105 mmol/L (ref 98–111)
Creatinine, Ser: 0.72 mg/dL (ref 0.44–1.00)
GFR, Estimated: >60 mL/min (ref >60)
Glucose, Bld: 121 mg/dL — ABNORMAL HIGH (ref 70–99)
Potassium: 3.5 mmol/L (ref 3.5–5.1)
Sodium: 138 mmol/L (ref 135–145)
Total Bilirubin: 0.5 mg/dL (ref 0.3–1.2)
Total Protein: 8.1 g/dL (ref 6.5–8.1)

## 2021-04-26 LAB — CBC WITH DIFFERENTIAL/PLATELET
Abs Immature Granulocytes: 0.03 10*3/uL (ref 0.00–0.07)
Basophils Absolute: 0 10*3/uL (ref 0.0–0.1)
Basophils Relative: 1 %
Eosinophils Absolute: 0 10*3/uL (ref 0.0–0.5)
Eosinophils Relative: 1 %
HCT: 39.5 % (ref 36.0–46.0)
Hemoglobin: 13.8 g/dL (ref 12.0–15.0)
Immature Granulocytes: 1 %
Lymphocytes Relative: 19 %
Lymphs Abs: 1.2 10*3/uL (ref 0.7–4.0)
MCH: 31 pg (ref 26.0–34.0)
MCHC: 34.9 g/dL (ref 30.0–36.0)
MCV: 88.8 fL (ref 80.0–100.0)
Monocytes Absolute: 0.5 10*3/uL (ref 0.1–1.0)
Monocytes Relative: 9 %
Neutro Abs: 4.4 10*3/uL (ref 1.7–7.7)
Neutrophils Relative %: 69 %
Platelets: 249 10*3/uL (ref 150–400)
RBC: 4.45 MIL/uL (ref 3.87–5.11)
RDW: 12.5 % (ref 11.5–15.5)
WBC: 6.2 10*3/uL (ref 4.0–10.5)
nRBC: 0 % (ref 0.0–0.2)

## 2021-04-26 LAB — URINALYSIS, COMPLETE (UACMP) WITH MICROSCOPIC
Bacteria, UA: NONE SEEN
Bilirubin Urine: NEGATIVE
Glucose, UA: NEGATIVE mg/dL
Hgb urine dipstick: NEGATIVE
Ketones, ur: NEGATIVE mg/dL
Leukocytes,Ua: NEGATIVE
Nitrite: NEGATIVE
Protein, ur: NEGATIVE mg/dL
Specific Gravity, Urine: 1.003 — ABNORMAL LOW (ref 1.005–1.030)
WBC, UA: NONE SEEN WBC/hpf (ref 0–5)
pH: 7 (ref 5.0–8.0)

## 2021-04-26 LAB — POC URINE PREG, ED: Preg Test, Ur: NEGATIVE

## 2021-04-26 LAB — RESP PANEL BY RT-PCR (FLU A&B, COVID) ARPGX2
Influenza A by PCR: NEGATIVE
Influenza B by PCR: NEGATIVE
SARS Coronavirus 2 by RT PCR: NEGATIVE

## 2021-04-26 LAB — TROPONIN I (HIGH SENSITIVITY): Troponin I (High Sensitivity): <2 ng/L (ref <18)

## 2021-04-26 LAB — D-DIMER, QUANTITATIVE: D-Dimer, Quant: 0.55 ug/mL-FEU — ABNORMAL HIGH (ref 0.00–0.50)

## 2021-04-26 MED ORDER — IOHEXOL 350 MG/ML SOLN
75.0000 mL | Freq: Once | INTRAVENOUS | Status: AC | PRN
  Administered 2021-04-26: 75 mL via INTRAVENOUS
  Filled 2021-04-26: qty 75

## 2021-04-26 MED ORDER — BENZONATATE 100 MG PO CAPS: 100.0000 mg | ORAL_CAPSULE | Freq: Three times a day (TID) | ORAL | 0 refills | Status: DC | PRN

## 2021-04-26 MED ORDER — ALBUTEROL SULFATE HFA 108 (90 BASE) MCG/ACT IN AERS: 2.0000 | INHALATION_SPRAY | Freq: Four times a day (QID) | RESPIRATORY_TRACT | 2 refills | Status: DC | PRN

## 2021-04-26 NOTE — ED Provider Notes (Signed)
[facility] Emergency Department Provider Note  ____________________________________________   Event Date/Time   First MD Initiated Contact with Patient 04/26/21 1001     (approximate)  I have reviewed the triage vital signs and the nursing notes.   HISTORY  Chief Complaint Cough  Patient is seen with the assistance of a medical Spanish interpreter  HPI Betty Vasquez is a 50 y.o. female who presents to the emergency department for evaluation of cough, chest pain, shortness of breath and generally just not feeling well.  Patient states that symptoms first began on Monday with a cough.  It has been productive with a white sputum.  She denies any hemoptysis.  She reports that she developed chest pain a few days ago with shortness of breath for 1 episode last night, denies any chest pain or shortness of breath today.  She denies any abdominal pain, nausea vomiting or diarrhea, denies any fevers or recent sick contacts.         Past Medical History:  Diagnosis Date   Anxiety    Hypertension     Patient Active Problem List   Diagnosis Date Noted   Elevated blood pressure reading 08/04/2018   History of tubal ligation 04/27/2017   History of 2 cesarean sections 04/27/2017    Past Surgical History:  Procedure Laterality Date   CESAREAN SECTION     2   TUBAL LIGATION      Prior to Admission medications   Medication Sig Start Date End Date Taking? Authorizing Provider  albuterol (VENTOLIN HFA) 108 (90 Base) MCG/ACT inhaler Inhale 2 puffs into the lungs every 6 (six) hours as needed for wheezing or shortness of breath. 04/26/21  Yes Kissie Ziolkowski, Ruben Gottron, PA  benzonatate (TESSALON PERLES) 100 MG capsule Take 1 capsule (100 mg total) by mouth 3 (three) times daily as needed for cough. 04/26/21 04/26/22 Yes Lucy Chris, PA  amitriptyline (ELAVIL) 25 MG tablet  07/19/19   [provider]  amLODipine (NORVASC) 5 MG tablet Take 5 mg by mouth  daily. Take 2 tablets by mouth daily    [provider]  sertraline (ZOLOFT) 50 MG tablet Take 50 mg by mouth daily.    [provider]    Allergies Codeine  Family History  Problem Relation Age of Onset   Hypertension Mother    Breast cancer Neg Hx    Ovarian cancer Neg Hx    Colon cancer Neg Hx    Diabetes Neg Hx     Social History Social History   Tobacco Use   Smoking status: Never   Smokeless tobacco: Never  Vaping Use   Vaping Use: Never used  Substance Use Topics   Alcohol use: No   Drug use: No    Review of Systems Constitutional: No fever/chills Eyes: No visual changes. ENT: No sore throat. Cardiovascular: + chest pain. Respiratory: + shortness of breath, + cough Gastrointestinal: No abdominal pain.  No nausea, no vomiting.  No diarrhea.  No constipation. Genitourinary: Negative for dysuria. Musculoskeletal: Negative for back pain. Skin: Negative for rash. Neurological: Negative for headaches, focal weakness or numbness.   ____________________________________________   PHYSICAL EXAM:  VITAL SIGNS: ED Triage Vitals  Enc Vitals Group     BP 04/26/21 0937 (!) 157/101     Pulse Rate 04/26/21 0937 (!) 109     Resp 04/26/21 0937 16     Temp 04/26/21 0937 98.6 F (37 C)     Temp Source 04/26/21  3295 Oral     SpO2 04/26/21 0937 100 %     Weight 04/26/21 0938 150 lb (68 kg)     Height 04/26/21 0937 5\' 4"  (1.626 m)     Head Circumference --      Peak Flow --      Pain Score 04/26/21 0937 8     Pain Loc --      Pain Edu? --      Excl. in GC? --    Constitutional: Alert and oriented. Well appearing and in no acute distress. Eyes: Conjunctivae are normal. PERRL. EOMI. Head: Atraumatic. Nose: No congestion/rhinnorhea. Mouth/Throat: Mucous membranes are moist.  Oropharynx non-erythematous. Neck: No stridor.   Cardiovascular: Mildly tachycardic, regular rhythm. Grossly normal heart sounds.  Good peripheral circulation. Respiratory:  Normal respiratory effort.  No retractions. Lungs CTAB. Gastrointestinal: Soft and nontender. No distention. No abdominal bruits. No CVA tenderness. Musculoskeletal: No lower extremity tenderness nor edema.  No joint effusions. Neurologic:  Normal speech and language. No gross focal neurologic deficits are appreciated. No gait instability. Skin:  Skin is warm, dry and intact. No rash noted. Psychiatric: Mood and affect are normal. Speech and behavior are normal.  ____________________________________________   LABS (all labs ordered are listed, but only abnormal results are displayed)  Labs Reviewed  COMPREHENSIVE METABOLIC PANEL - Abnormal; Notable for the following components:      Result Value   Glucose, Bld 121 (*)    All other components within normal limits  URINALYSIS, COMPLETE (UACMP) WITH MICROSCOPIC - Abnormal; Notable for the following components:   Color, Urine STRAW (*)    APPearance CLEAR (*)    Specific Gravity, Urine 1.003 (*)    All other components within normal limits  D-DIMER, QUANTITATIVE - Abnormal; Notable for the following components:   D-Dimer, Quant 0.55 (*)    All other components within normal limits  RESP PANEL BY RT-PCR (FLU A&B, COVID) ARPGX2  CBC WITH DIFFERENTIAL/PLATELET  POC URINE PREG, ED  TROPONIN I (HIGH SENSITIVITY)   ____________________________________________  EKG  Sinus tachycardia with a rate of 103.  No ST elevation or depression, normal axis. ____________________________________________  RADIOLOGY 04/28/21, personally viewed and evaluated these images (plain radiographs) as part of my medical decision making, as well as reviewing the written report by the radiologist.  ED provider interpretation: 2 view chest does not reveal any focal pneumonia or other acute cardiopulmonary disease  Official radiology report(s): DG Chest 2 View  Result Date: 04/26/2021 CLINICAL DATA:  Chest pain and shortness of breath. EXAM: CHEST  - 2 VIEW COMPARISON:  None. FINDINGS: The heart size and mediastinal contours are within normal limits. Both lungs are clear. No pleural effusion or pneumothorax. The visualized skeletal structures are unremarkable. IMPRESSION: No active cardiopulmonary disease. Electronically Signed   By: 04/28/2021 M.D.   On: 04/26/2021 10:56   CT Angio Chest PE W and/or Wo Contrast  Result Date: 04/26/2021 CLINICAL DATA:  Pt here with congestion since Monday and feels tired with chest congestion. When asked to describe how she's feeling pt states" I feel congestion and tired". EXAM: CT ANGIOGRAPHY CHEST WITH CONTRAST TECHNIQUE: Multidetector CT imaging of the chest was performed using the standard protocol during bolus administration of intravenous contrast. Multiplanar CT image reconstructions and MIPs were obtained to evaluate the vascular anatomy. CONTRAST:  56mL OMNIPAQUE IOHEXOL 350 MG/ML SOLN COMPARISON:  None. FINDINGS: Cardiovascular: Pulmonary arteries well opacified. There is no evidence of a pulmonary embolism. Normal heart.  No pericardial effusion. Great vessels are unremarkable. No aortic dissection. Mediastinum/Nodes: No enlarged mediastinal, hilar, or axillary lymph nodes. Thyroid gland, trachea, and esophagus demonstrate no significant findings. Lungs/Pleura: Lungs are clear. No pleural effusion or pneumothorax. Upper Abdomen: No acute abnormality. Musculoskeletal: No fracture or acute finding. No bone lesion. No chest wall masses. Review of the MIP images confirms the above findings. IMPRESSION: 1. No evidence of a pulmonary embolism. 2. No acute findings.  Clear lungs. Electronically Signed   By: Amie Portland M.D.   On: 04/26/2021 11:51      ____________________________________________   INITIAL IMPRESSION / ASSESSMENT AND PLAN / ED COURSE  As part of my medical decision making, I reviewed the following data within the electronic MEDICAL RECORD NUMBER Nursing notes reviewed and incorporated,  Interpreter needed, Labs reviewed, Radiograph reviewed, and Notes from prior ED visits        Patient is a 50 year old female who reports to the emergency department for evaluation of generalized not feeling well over the last week with a cough with white sputum production with chest pain for the last few days with one episode of shortness of breath.  See HPI for further details.  In triage patient is noted to be tachycardic with a rate of 109, hypertensive with a rate of 157/101, otherwise vitals are within normal limits.  Physical exam grossly unremarkable except for the noted tachycardia with no tenderness to palpation of the chest wall, not abdominal pain, normal auscultation.  Laboratory evaluation was obtained and she has a normal troponin, normal CBC, CMP.  Chest x-ray and EKG also obtained, grossly without any abnormal finding.  Her D-dimer is noted to be elevated at 0.55.  Given the presentation with chest pain and shortness of breath, tachycardia and this finding, will obtain a CT angio to rule out PE.  This is negative for any PE or other acute finding.  Patient notes that she is feeling improved now, denies any episodes of chest pain or shortness of breath while here.  Will initiate treatment outpatient with encouraged anti-inflammatory, Tessalon Perles for cough and albuterol as needed for any episodes of shortness of breath.  Patient is amenable with this plan, return precautions were discussed feeling that she stable this time for outpatient follow-up.      ____________________________________________   FINAL CLINICAL IMPRESSION(S) / ED DIAGNOSES  Final diagnoses:  Viral illness  Atypical chest pain     ED Discharge Orders          Ordered    benzonatate (TESSALON PERLES) 100 MG capsule  3 times daily PRN        04/26/21 1256    albuterol (VENTOLIN HFA) 108 (90 Base) MCG/ACT inhaler  Every 6 hours PRN        04/26/21 1256             Note:  This document was  prepared using Dragon voice recognition software and may include unintentional dictation errors.    Lucy Chris, PA 04/26/21 1620    Gilles Chiquito, MD 04/26/21 [phone]

## 2021-04-26 NOTE — ED Triage Notes (Signed)
Pt here with congestion since Monday and feels tired with chest congestion. When asked to describe how she's feeling pt states" I feel congestion and tired".

## 2021-04-26 NOTE — Discharge Instructions (Addendum)
You may use albuterol if needed for shortness of breath.  You may also use the prescribed Tessalon Perles for cough.  Please take an anti-inflammatory such as ibuprofen, 600 mg up to 3 times daily for discomfort of your chest wall.  Follow-up with your primary care, or return to the emergency department if you experience any worsening or changes in symptoms.

## 2021-04-26 NOTE — ED Notes (Signed)
See triage note  States she feels weak on arrival   Also that she noticed some congestion and felt SOB this am  And like her heart was beating fast  Afebrile on arrival

## 2021-05-08 ENCOUNTER — Other Ambulatory Visit: Payer: Self-pay

## 2021-05-08 DIAGNOSIS — M436 Torticollis: Secondary | ICD-10-CM | POA: Insufficient documentation

## 2021-05-08 DIAGNOSIS — I10 Essential (primary) hypertension: Secondary | ICD-10-CM | POA: Insufficient documentation

## 2021-05-08 DIAGNOSIS — Z79899 Other long term (current) drug therapy: Secondary | ICD-10-CM | POA: Diagnosis not present

## 2021-05-08 DIAGNOSIS — M62838 Other muscle spasm: Secondary | ICD-10-CM | POA: Insufficient documentation

## 2021-05-08 DIAGNOSIS — M542 Cervicalgia: Secondary | ICD-10-CM | POA: Diagnosis present

## 2021-05-08 NOTE — ED Triage Notes (Signed)
Pt in with co neck pain that radiates into both shoulders and upper back. Pt has hx of neck pain states hx of muscle spasms, here for worsening pain no injury.

## 2021-05-09 ENCOUNTER — Emergency Department
Admission: EM | Admit: 2021-05-09 | Discharge: 2021-05-09 | Disposition: A | Discharge status: Home / Self Care | Payer: BC Managed Care – PPO | Attending: Emergency Medicine | Admitting: Emergency Medicine

## 2021-05-09 DIAGNOSIS — M62838 Other muscle spasm: Secondary | ICD-10-CM

## 2021-05-09 DIAGNOSIS — M436 Torticollis: Secondary | ICD-10-CM

## 2021-05-09 MED ORDER — NAPROXEN 500 MG PO TABS
500.0000 mg | ORAL_TABLET | Freq: Once | ORAL | Status: AC
  Administered 2021-05-09: 500 mg via ORAL
  Filled 2021-05-09: qty 1

## 2021-05-09 MED ORDER — LIDOCAINE HCL (PF) 1 % IJ SOLN
10.0000 mL | Freq: Once | INTRAMUSCULAR | Status: AC
  Administered 2021-05-09: 10 mL
  Filled 2021-05-09: qty 10

## 2021-05-09 NOTE — Discharge Instructions (Addendum)
Use Tylenol for pain and fevers.  Up to 1000 mg per dose, up to 4 times per day.  Do not take more than 4000 mg of Tylenol/acetaminophen within 24 hours..  Use naproxen/Aleve for anti-inflammatory pain relief. Use up to 500mg  every 12 hours. Do not take more frequently than this. Do not use other NSAIDs (ibuprofen, Advil) while taking this medication. It is safe to take Tylenol with this.   If you develop fevers with your symptoms, severe pain not controlled with the above medications, please return to the ED

## 2021-05-09 NOTE — ED Provider Notes (Signed)
[facility] Emergency Department Provider Note ____________________________________________   Event Date/Time   First MD Initiated Contact with Patient 05/09/21 0033     (approximate)  I have reviewed the triage vital signs and the nursing notes.  HISTORY  Chief Complaint Torticollis   HPI Betty Vasquez is a 50 y.o. femalewho presents to the ED for evaluation of neck pain.   Chart review indicates no relevant history.  Patient presents to the ED for evaluation of atraumatic pain to her bilateral shoulders, radiating up her posterior neck towards her occiput.  She reports the pain has been present for about a week, with minimal improvement with home Tylenol.  She reports aching pain to her bilateral shoulders.  She reports a history of muscular spasm and this feels similar.  Denies any falls, trauma, syncopal episodes, fever.   Marland Kitchen  History and physical obtained with the assistance of Spanish interpreter  Past Medical History:  Diagnosis Date   Anxiety    Hypertension     Patient Active Problem List   Diagnosis Date Noted   Elevated blood pressure reading 08/04/2018   History of tubal ligation 04/27/2017   History of 2 cesarean sections 04/27/2017    Past Surgical History:  Procedure Laterality Date   CESAREAN SECTION     2   TUBAL LIGATION      Prior to Admission medications   Medication Sig Start Date End Date Taking? Authorizing Provider  albuterol (VENTOLIN HFA) 108 (90 Base) MCG/ACT inhaler Inhale 2 puffs into the lungs every 6 (six) hours as needed for wheezing or shortness of breath. 04/26/21   Lucy Chris, PA  amitriptyline (ELAVIL) 25 MG tablet  07/19/19   [provider]  amLODipine (NORVASC) 5 MG tablet Take 5 mg by mouth daily. Take 2 tablets by mouth daily    [provider]  benzonatate (TESSALON PERLES) 100 MG capsule Take 1 capsule (100 mg total) by mouth 3 (three) times daily as needed for cough.  04/26/21 04/26/22  Lucy Chris, PA  sertraline (ZOLOFT) 50 MG tablet Take 50 mg by mouth daily.    [provider]    Allergies Codeine  Family History  Problem Relation Age of Onset   Hypertension Mother    Breast cancer Neg Hx    Ovarian cancer Neg Hx    Colon cancer Neg Hx    Diabetes Neg Hx     Social History Social History   Tobacco Use   Smoking status: Never   Smokeless tobacco: Never  Vaping Use   Vaping Use: Never used  Substance Use Topics   Alcohol use: No   Drug use: No    Review of Systems  Constitutional: No fever/chills Eyes: No visual changes. ENT: No sore throat. Cardiovascular: Denies chest pain. Respiratory: Denies shortness of breath. Gastrointestinal: No abdominal pain.  No nausea, no vomiting.  No diarrhea.  No constipation. Genitourinary: Negative for dysuria. Musculoskeletal: Positive for atraumatic thoracic back and shoulder/trapezius pain bilaterally Skin: Negative for rash. Neurological: Negative for focal weakness or numbness.  ____________________________________________   PHYSICAL EXAM:  VITAL SIGNS: Vitals:   05/08/21 2136 05/09/21 0141  BP: (!) 148/102 (!) 155/91  Pulse: 96 84  Resp: 20 17  Temp: 98.4 F (36.9 C)   SpO2: 97% 98%     Constitutional: Alert and oriented. Well appearing and in no acute distress. Eyes: Conjunctivae are normal. PERRL. EOMI. Head: Atraumatic. Nose: No congestion/rhinnorhea. Mouth/Throat: Mucous membranes are moist.  Oropharynx non-erythematous. Neck: No stridor. No cervical spine tenderness to palpation. Cardiovascular: Normal rate, regular rhythm. Grossly normal heart sounds.  Good peripheral circulation. Respiratory: Normal respiratory effort.  No retractions. Lungs CTAB. Gastrointestinal: Soft , nondistended, nontender to palpation. No CVA tenderness. Musculoskeletal: No lower extremity tenderness nor edema.  No joint effusions. No signs of acute trauma. Tenderness to  palpation to bilateral trapezius musculature is with palpable muscular cord present.  This reproduces her symptoms.  No overlying skin changes or signs of trauma.  No midline bony tenderness or bony step-offs. Neurologic:  Normal speech and language. No gross focal neurologic deficits are appreciated. No gait instability noted. Skin:  Skin is warm, dry and intact. No rash noted. Psychiatric: Mood and affect are normal. Speech and behavior are normal.  ____________________________________________   PROCEDURES and INTERVENTIONS  Procedure(s) performed (including Critical Care):  Procedures  Trigger point injection After discussing risks versus benefits, patient is agreeable to proceed with trigger point injection for treatment of acute muscular spasm. Risks discussed include worsening pain, infection, neurovascular damage and pneumothorax.  Point of maximal tenderness was identified at bilateral trapezius musculature Overlying skin was cleaned with alcohol swabs. 1% lidocaine without epinephrine was incrementally injected, after confirming negative drawback, into this musculature. Total of injected. Well-tolerated without any apparent complications.   Medications  lidocaine (PF) (XYLOCAINE) 1 % injection 10 mL (10 mLs Infiltration Given 05/09/21 0142)  naproxen (NAPROSYN) tablet 500 mg (500 mg Oral Given 05/09/21 0142)    ____________________________________________   MDM / ED COURSE   50 year old woman presents to the ED with atraumatic pain and tenderness to her bilateral trapezius musculature, with evidence of muscular strain and spasm, amenable to outpatient management.  Exam generally reassuring without evidence of trauma, skin changes, neurologic or vascular deficits.  She has tenderness to palpation and a palpable muscular cord to her bilateral trapezius musculature, with spasm as the likely etiology of her symptoms.  Provided anti-inflammatories and trigger point  injection with resolving symptoms.  I see no indications for further diagnostics or work-up here in the ED.  We discussed management at home and return precautions for the ED prior to discharge.  Clinical Course as of 05/09/21 0224  Thu May 09, 2021  0158 Trigger point injection performed. Well tolerated. 8cc total across 4 sites to bilateral trapezius [DS]  0222 Reassessed.  Patient reports resolving symptoms.  We discussed management at home muscular spasms and we discussed return precautions for the ED. [DS]    Clinical Course User Index [DS] Delton Prairie, MD    ____________________________________________   FINAL CLINICAL IMPRESSION(S) / ED DIAGNOSES  Final diagnoses:  Trapezius muscle spasm  Torticollis, acute     ED Discharge Orders     None        Shaughn Thomley Katrinka Blazing   Note:  This document was prepared using Dragon voice recognition software and may include unintentional dictation errors.    Delton Prairie, MD 05/09/21 [phone]

## 2021-05-09 NOTE — ED Notes (Signed)
Lidocaine at the bedside 

## 2021-05-09 NOTE — ED Notes (Signed)
Discharge instructions with interpreter [phone] provided by writer and all questions/concerns answered.

## 2021-08-27 ENCOUNTER — Encounter: Payer: Medicare Other | Admitting: Obstetrics and Gynecology

## 2021-08-28 ENCOUNTER — Encounter: Payer: BC Managed Care – PPO | Admitting: Obstetrics and Gynecology

## 2021-10-10 ENCOUNTER — Encounter: Payer: Self-pay | Admitting: Obstetrics and Gynecology

## 2021-10-10 ENCOUNTER — Other Ambulatory Visit: Payer: Self-pay

## 2021-10-10 ENCOUNTER — Ambulatory Visit (INDEPENDENT_AMBULATORY_CARE_PROVIDER_SITE_OTHER): Payer: Medicare Other | Admitting: Obstetrics and Gynecology

## 2021-10-10 DIAGNOSIS — Z1231 Encounter for screening mammogram for malignant neoplasm of breast: Secondary | ICD-10-CM

## 2021-10-10 DIAGNOSIS — Z01419 Encounter for gynecological examination (general) (routine) without abnormal findings: Secondary | ICD-10-CM | POA: Diagnosis not present

## 2021-10-10 NOTE — Progress Notes (Signed)
HPI:      Betty Vasquez is a 50 y.o. [id number] who LMP was Patient's last menstrual period was 09/21/2021 (exact date).  Subjective:   She presents today for her annual examination.  She has no complaints.  She reports that she continues to have normal regular cycles.  She has a history of ligation for birth control. At her last visit she had breast tenderness but she says this is now resolved.    Hx: The following portions of the patient's history were reviewed and updated as appropriate:             She  has a past medical history of Anxiety and Hypertension. She does not have any pertinent problems on file. She  has a past surgical history that includes Cesarean section and Tubal ligation. Her family history includes Hypertension in her mother. She  reports that she has never smoked. She has never used smokeless tobacco. She reports that she does not drink alcohol and does not use drugs. She has a current medication list which includes the following prescription(s): amitriptyline, amlodipine, and sertraline. She is allergic to codeine.       Review of Systems:  Review of Systems  Constitutional: Denied constitutional symptoms, night sweats, recent illness, fatigue, fever, insomnia and weight loss.  Eyes: Denied eye symptoms, eye pain, photophobia, vision change and visual disturbance.  Ears/Nose/Throat/Neck: Denied ear, nose, throat or neck symptoms, hearing loss, nasal discharge, sinus congestion and sore throat.  Cardiovascular: Denied cardiovascular symptoms, arrhythmia, chest pain/pressure, edema, exercise intolerance, orthopnea and palpitations.  Respiratory: Denied pulmonary symptoms, asthma, pleuritic pain, productive sputum, cough, dyspnea and wheezing.  Gastrointestinal: Denied, gastro-esophageal reflux, melena, nausea and vomiting.  Genitourinary: Denied genitourinary symptoms including symptomatic vaginal discharge, pelvic relaxation issues, and urinary complaints.   Musculoskeletal: Denied musculoskeletal symptoms, stiffness, swelling, muscle weakness and myalgia.  Dermatologic: Denied dermatology symptoms, rash and scar.  Neurologic: Denied neurology symptoms, dizziness, headache, neck pain and syncope.  Psychiatric: Denied psychiatric symptoms, anxiety and depression.  Endocrine: Denied endocrine symptoms including hot flashes and night sweats.   Meds:   Current Outpatient Medications on File Prior to Visit  Medication Sig Dispense Refill   amitriptyline (ELAVIL) 25 MG tablet      amLODipine (NORVASC) 5 MG tablet Take 5 mg by mouth daily. Take 2 tablets by mouth daily     sertraline (ZOLOFT) 50 MG tablet Take 50 mg by mouth daily.     No current facility-administered medications on file prior to visit.     Objective:     Vitals:   10/10/21 1208  BP: (!) 144/88  Pulse: (!) 111  Resp: 16    Filed Weights   10/10/21 1208  Weight: 141 lb 12.8 oz (64.3 kg)              Physical examination General NAD, Conversant  HEENT Atraumatic; Op clear with mmm.  Normo-cephalic. Pupils reactive. Anicteric sclerae  Thyroid/Neck Smooth without nodularity or enlargement. Normal ROM.  Neck Supple.  Skin No rashes, lesions or ulceration. Normal palpated skin turgor. No nodularity.  Breasts: No masses or discharge.  Symmetric.  No axillary adenopathy.  Lungs: Clear to auscultation.No rales or wheezes. Normal Respiratory effort, no retractions.  Heart: NSR.  No murmurs or rubs appreciated. No periferal edema  Abdomen: Soft.  Non-tender.  No masses.  No HSM. No hernia  Extremities: Moves all appropriately.  Normal ROM for age. No lymphadenopathy.  Neuro: Oriented to PPT.  Normal mood.  Normal affect.     Pelvic:   Vulva: Normal appearance.  No lesions.  Vagina: No lesions or abnormalities noted.  Support: Normal pelvic support.  Urethra No masses tenderness or scarring.  Meatus Normal size without lesions or prolapse.  Cervix: Normal appearance.  No  lesions.  Anus: Normal exam.  No lesions.  Perineum: Normal exam.  No lesions.        Bimanual   Uterus: Top normal size   non-tender.  Mobile.  AV.  Adnexae: No masses.  Non-tender to palpation.  Cul-de-sac: Negative for abnormality.     Assessment:    [id number] Patient Active Problem List   Diagnosis Date Noted   Elevated blood pressure reading 08/04/2018   History of tubal ligation 04/27/2017   History of 2 cesarean sections 04/27/2017     1. Encounter for screening mammogram for malignant neoplasm of breast   2. Encounter for well woman exam with routine gynecological exam        Plan:            1.  Basic Screening Recommendations The basic screening recommendations for asymptomatic women were discussed with the patient during her visit.  The age-appropriate recommendations were discussed with her and the rational for the tests reviewed.  When I am informed by the patient that another primary care physician has previously obtained the age-appropriate tests and they are up-to-date, only outstanding tests are ordered and referrals given as necessary.  Abnormal results of tests will be discussed with her when all of her results are completed.  Routine preventative health maintenance measures emphasized: Exercise/Diet/Weight control, Tobacco Warnings, Alcohol/Substance use risks and Stress Management Blood work ordered-mammogram ordered.  Orders Orders Placed This Encounter  Procedures   MM 3D SCREEN BREAST BILATERAL   Basic metabolic panel   CBC   Hemoglobin A1c   Lipid panel   TSH    No orders of the defined types were placed in this encounter.         F/U  Return in about 1 year (around 10/10/2022) for Annual Physical.  Elonda Husky, M.D. 10/10/2021 12:29 PM

## 2021-10-14 ENCOUNTER — Other Ambulatory Visit: Payer: Self-pay

## 2021-10-14 ENCOUNTER — Other Ambulatory Visit: Payer: Medicare Other

## 2021-10-15 LAB — BASIC METABOLIC PANEL
BUN/Creatinine Ratio: 18 (ref 9–23)
BUN: 14 mg/dL (ref 6–24)
CO2: 22 mmol/L (ref 20–29)
Calcium: 9.4 mg/dL (ref 8.7–10.2)
Chloride: 103 mmol/L (ref 96–106)
Creatinine, Ser: 0.78 mg/dL (ref 0.57–1.00)
Glucose: 95 mg/dL (ref 70–99)
Potassium: 4.4 mmol/L (ref 3.5–5.2)
Sodium: 138 mmol/L (ref 134–144)
eGFR: 94 mL/min/{1.73_m2} (ref >59)

## 2021-10-15 LAB — LIPID PANEL
Chol/HDL Ratio: 4.2 ratio (ref 0.0–4.4)
Cholesterol, Total: 199 mg/dL (ref 100–199)
HDL: 47 mg/dL (ref >39)
LDL Chol Calc (NIH): 128 mg/dL — ABNORMAL HIGH (ref 0–99)
Triglycerides: 135 mg/dL (ref 0–149)
VLDL Cholesterol Cal: 24 mg/dL (ref 5–40)

## 2021-10-15 LAB — CBC
Hematocrit: 38.8 % (ref 34.0–46.6)
Hemoglobin: 13 g/dL (ref 11.1–15.9)
MCH: 31 pg (ref 26.6–33.0)
MCHC: 33.5 g/dL (ref 31.5–35.7)
MCV: 92 fL (ref 79–97)
Platelets: 255 10*3/uL (ref 150–450)
RBC: 4.2 x10E6/uL (ref 3.77–5.28)
RDW: 12.3 % (ref 11.7–15.4)
WBC: 6.2 10*3/uL (ref 3.4–10.8)

## 2021-10-15 LAB — TSH: TSH: 2.37 u[IU]/mL (ref 0.450–4.500)

## 2021-10-15 LAB — HEMOGLOBIN A1C
Est. average glucose Bld gHb Est-mCnc: 97 mg/dL
Hgb A1c MFr Bld: 5 % (ref 4.8–5.6)

## 2021-11-17 ENCOUNTER — Observation Stay: Payer: BLUE CROSS/BLUE SHIELD

## 2021-11-17 ENCOUNTER — Other Ambulatory Visit: Payer: Self-pay

## 2021-11-17 ENCOUNTER — Observation Stay: Payer: BLUE CROSS/BLUE SHIELD | Admitting: Certified Registered"

## 2021-11-17 ENCOUNTER — Observation Stay
Admission: EM | Admit: 2021-11-17 | Discharge: 2021-11-18 | Disposition: A | Discharge status: Home / Self Care | Payer: BLUE CROSS/BLUE SHIELD | Attending: Emergency Medicine | Admitting: Emergency Medicine

## 2021-11-17 ENCOUNTER — Encounter
Admission: EM | Disposition: A | Discharge status: Home / Self Care | Payer: Self-pay | Source: Home / Self Care | Attending: Emergency Medicine

## 2021-11-17 ENCOUNTER — Emergency Department: Payer: BLUE CROSS/BLUE SHIELD

## 2021-11-17 DIAGNOSIS — I10 Essential (primary) hypertension: Secondary | ICD-10-CM | POA: Diagnosis not present

## 2021-11-17 DIAGNOSIS — Z79899 Other long term (current) drug therapy: Secondary | ICD-10-CM | POA: Insufficient documentation

## 2021-11-17 DIAGNOSIS — Z20822 Contact with and (suspected) exposure to covid-19: Secondary | ICD-10-CM | POA: Insufficient documentation

## 2021-11-17 DIAGNOSIS — Z419 Encounter for procedure for purposes other than remedying health state, unspecified: Secondary | ICD-10-CM

## 2021-11-17 DIAGNOSIS — R1031 Right lower quadrant pain: Secondary | ICD-10-CM | POA: Diagnosis present

## 2021-11-17 DIAGNOSIS — K353 Acute appendicitis with localized peritonitis, without perforation or gangrene: Secondary | ICD-10-CM | POA: Diagnosis not present

## 2021-11-17 DIAGNOSIS — K429 Umbilical hernia without obstruction or gangrene: Secondary | ICD-10-CM

## 2021-11-17 DIAGNOSIS — K3580 Unspecified acute appendicitis: Secondary | ICD-10-CM | POA: Diagnosis not present

## 2021-11-17 HISTORY — PX: LAPAROSCOPIC APPENDECTOMY: SHX408

## 2021-11-17 LAB — URINALYSIS, ROUTINE W REFLEX MICROSCOPIC
Bilirubin Urine: NEGATIVE
Glucose, UA: NEGATIVE mg/dL
Ketones, ur: NEGATIVE mg/dL
Leukocytes,Ua: NEGATIVE
Nitrite: NEGATIVE
Protein, ur: NEGATIVE mg/dL
Specific Gravity, Urine: 1.02 (ref 1.005–1.030)
pH: 6.5 (ref 5.0–8.0)

## 2021-11-17 LAB — LIPASE, BLOOD: Lipase: 39 U/L (ref 11–51)

## 2021-11-17 LAB — COMPREHENSIVE METABOLIC PANEL
ALT: 15 U/L (ref 0–44)
AST: 18 U/L (ref 15–41)
Albumin: 4.5 g/dL (ref 3.5–5.0)
Alkaline Phosphatase: 46 U/L (ref 38–126)
Anion gap: 6 (ref 5–15)
BUN: 13 mg/dL (ref 6–20)
CO2: 26 mmol/L (ref 22–32)
Calcium: 9.4 mg/dL (ref 8.9–10.3)
Chloride: 105 mmol/L (ref 98–111)
Creatinine, Ser: 0.75 mg/dL (ref 0.44–1.00)
GFR, Estimated: >60 mL/min (ref >60)
Glucose, Bld: 122 mg/dL — ABNORMAL HIGH (ref 70–99)
Potassium: 3.9 mmol/L (ref 3.5–5.1)
Sodium: 137 mmol/L (ref 135–145)
Total Bilirubin: 0.5 mg/dL (ref 0.3–1.2)
Total Protein: 7.8 g/dL (ref 6.5–8.1)

## 2021-11-17 LAB — URINALYSIS, MICROSCOPIC (REFLEX): Bacteria, UA: NONE SEEN

## 2021-11-17 LAB — CBC
HCT: 39.9 % (ref 36.0–46.0)
Hemoglobin: 13.5 g/dL (ref 12.0–15.0)
MCH: 30.8 pg (ref 26.0–34.0)
MCHC: 33.8 g/dL (ref 30.0–36.0)
MCV: 90.9 fL (ref 80.0–100.0)
Platelets: 310 10*3/uL (ref 150–400)
RBC: 4.39 MIL/uL (ref 3.87–5.11)
RDW: 12.1 % (ref 11.5–15.5)
WBC: 16.3 10*3/uL — ABNORMAL HIGH (ref 4.0–10.5)
nRBC: 0 % (ref 0.0–0.2)

## 2021-11-17 LAB — RESP PANEL BY RT-PCR (FLU A&B, COVID) ARPGX2
Influenza A by PCR: NEGATIVE
Influenza B by PCR: NEGATIVE
SARS Coronavirus 2 by RT PCR: NEGATIVE

## 2021-11-17 LAB — POC URINE PREG, ED: Preg Test, Ur: NEGATIVE

## 2021-11-17 SURGERY — APPENDECTOMY, LAPAROSCOPIC
Anesthesia: General

## 2021-11-17 MED ORDER — FENTANYL CITRATE (PF) 100 MCG/2ML IJ SOLN
INTRAMUSCULAR | Status: AC
  Filled 2021-11-17: qty 2

## 2021-11-17 MED ORDER — ACETAMINOPHEN 500 MG PO TABS
1000.0000 mg | ORAL_TABLET | Freq: Four times a day (QID) | ORAL | Status: DC
  Administered 2021-11-18: 1000 mg via ORAL
  Filled 2021-11-17: qty 2

## 2021-11-17 MED ORDER — BUPIVACAINE-EPINEPHRINE (PF) 0.5% -1:200000 IJ SOLN
INTRAMUSCULAR | Status: DC | PRN
  Administered 2021-11-17: 30 mL via PERINEURAL

## 2021-11-17 MED ORDER — IOHEXOL 350 MG/ML SOLN
80.0000 mL | Freq: Once | INTRAVENOUS | Status: AC | PRN
  Administered 2021-11-17: 80 mL via INTRAVENOUS
  Filled 2021-11-17: qty 80

## 2021-11-17 MED ORDER — LACTATED RINGERS IV SOLN: INTRAVENOUS | Status: DC | PRN

## 2021-11-17 MED ORDER — DEXAMETHASONE SODIUM PHOSPHATE 10 MG/ML IJ SOLN
INTRAMUSCULAR | Status: AC
  Filled 2021-11-17: qty 1

## 2021-11-17 MED ORDER — PIPERACILLIN-TAZOBACTAM 3.375 G IVPB
3.3750 g | Freq: Three times a day (TID) | INTRAVENOUS | Status: DC
  Administered 2021-11-18: 3.375 g via INTRAVENOUS
  Filled 2021-11-17: qty 50

## 2021-11-17 MED ORDER — ONDANSETRON 4 MG PO TBDP: 4.0000 mg | ORAL_TABLET | Freq: Four times a day (QID) | ORAL | Status: DC | PRN

## 2021-11-17 MED ORDER — FENTANYL CITRATE (PF) 100 MCG/2ML IJ SOLN
INTRAMUSCULAR | Status: AC
  Administered 2021-11-17: 25 ug via INTRAVENOUS
  Filled 2021-11-17: qty 2

## 2021-11-17 MED ORDER — ONDANSETRON HCL 4 MG/2ML IJ SOLN: 4.0000 mg | Freq: Once | INTRAMUSCULAR | Status: DC | PRN

## 2021-11-17 MED ORDER — ONDANSETRON HCL 4 MG/2ML IJ SOLN
4.0000 mg | Freq: Four times a day (QID) | INTRAMUSCULAR | Status: DC | PRN
  Administered 2021-11-17: 4 mg via INTRAVENOUS
  Filled 2021-11-17: qty 2

## 2021-11-17 MED ORDER — AMLODIPINE BESYLATE 10 MG PO TABS
10.0000 mg | ORAL_TABLET | Freq: Every day | ORAL | Status: DC
  Administered 2021-11-18: 10 mg via ORAL
  Filled 2021-11-17: qty 1

## 2021-11-17 MED ORDER — DEXAMETHASONE SODIUM PHOSPHATE 10 MG/ML IJ SOLN
INTRAMUSCULAR | Status: DC | PRN
  Administered 2021-11-17: 10 mg via INTRAVENOUS

## 2021-11-17 MED ORDER — SUCCINYLCHOLINE CHLORIDE 200 MG/10ML IV SOSY
PREFILLED_SYRINGE | INTRAVENOUS | Status: DC | PRN
  Administered 2021-11-17: 140 mg via INTRAVENOUS

## 2021-11-17 MED ORDER — ONDANSETRON HCL 4 MG/2ML IJ SOLN
INTRAMUSCULAR | Status: AC
  Filled 2021-11-17: qty 2

## 2021-11-17 MED ORDER — PANTOPRAZOLE SODIUM 40 MG IV SOLR: 40.0000 mg | Freq: Every day | INTRAVENOUS | Status: DC

## 2021-11-17 MED ORDER — 0.9 % SODIUM CHLORIDE (POUR BTL) OPTIME
TOPICAL | Status: DC | PRN
  Administered 2021-11-17: 500 mL
  Administered 2021-11-17: 1000 mL

## 2021-11-17 MED ORDER — SODIUM CHLORIDE 0.9 % IV SOLN
12.5000 mg | Freq: Four times a day (QID) | INTRAVENOUS | Status: DC | PRN
  Filled 2021-11-17 (×2): qty 0.5

## 2021-11-17 MED ORDER — DULOXETINE HCL 30 MG PO CPEP
30.0000 mg | ORAL_CAPSULE | Freq: Every day | ORAL | Status: DC
  Filled 2021-11-17: qty 1

## 2021-11-17 MED ORDER — ROCURONIUM BROMIDE 10 MG/ML (PF) SYRINGE
PREFILLED_SYRINGE | INTRAVENOUS | Status: AC
  Filled 2021-11-17: qty 10

## 2021-11-17 MED ORDER — PIPERACILLIN-TAZOBACTAM 4.5 G IVPB
4.5000 g | Freq: Once | INTRAVENOUS | Status: AC
  Administered 2021-11-17: 4.5 g via INTRAVENOUS
  Filled 2021-11-17: qty 100

## 2021-11-17 MED ORDER — PROPOFOL 10 MG/ML IV BOLUS
INTRAVENOUS | Status: AC
  Filled 2021-11-17: qty 20

## 2021-11-17 MED ORDER — ONDANSETRON HCL 4 MG/2ML IJ SOLN
INTRAMUSCULAR | Status: DC | PRN
  Administered 2021-11-17: 4 mg via INTRAVENOUS

## 2021-11-17 MED ORDER — LACTATED RINGERS IV SOLN
125.0000 mL/h | INTRAVENOUS | Status: DC
  Administered 2021-11-17: 125 mL/h via INTRAVENOUS

## 2021-11-17 MED ORDER — POLYETHYLENE GLYCOL 3350 17 G PO PACK: 17.0000 g | PACK | Freq: Every day | ORAL | Status: DC | PRN

## 2021-11-17 MED ORDER — OXYCODONE HCL 5 MG PO TABS: 5.0000 mg | ORAL_TABLET | ORAL | Status: DC | PRN

## 2021-11-17 MED ORDER — LIDOCAINE HCL (CARDIAC) PF 100 MG/5ML IV SOSY
PREFILLED_SYRINGE | INTRAVENOUS | Status: DC | PRN
Start: 2021-11-17 — End: 2021-11-17
  Administered 2021-11-17: 80 mg via INTRAVENOUS

## 2021-11-17 MED ORDER — PHENYLEPHRINE HCL-NACL 20-0.9 MG/250ML-% IV SOLN
INTRAVENOUS | Status: DC | PRN
  Administered 2021-11-17: 160 ug via INTRAVENOUS
  Administered 2021-11-17: 40 ug/min via INTRAVENOUS

## 2021-11-17 MED ORDER — HYDROMORPHONE HCL 1 MG/ML IJ SOLN
0.5000 mg | INTRAMUSCULAR | Status: DC | PRN
  Administered 2021-11-18: 0.5 mg via INTRAVENOUS
  Filled 2021-11-17: qty 0.5

## 2021-11-17 MED ORDER — BUPIVACAINE-EPINEPHRINE (PF) 0.5% -1:200000 IJ SOLN
INTRAMUSCULAR | Status: AC
  Filled 2021-11-17: qty 30

## 2021-11-17 MED ORDER — FENTANYL CITRATE (PF) 100 MCG/2ML IJ SOLN
INTRAMUSCULAR | Status: DC | PRN
  Administered 2021-11-17 (×4): 50 ug via INTRAVENOUS

## 2021-11-17 MED ORDER — SUCCINYLCHOLINE CHLORIDE 200 MG/10ML IV SOSY
PREFILLED_SYRINGE | INTRAVENOUS | Status: AC
  Filled 2021-11-17: qty 10

## 2021-11-17 MED ORDER — MIDAZOLAM HCL 2 MG/2ML IJ SOLN
INTRAMUSCULAR | Status: AC
  Filled 2021-11-17: qty 2

## 2021-11-17 MED ORDER — PHENYLEPHRINE HCL (PRESSORS) 10 MG/ML IV SOLN
INTRAVENOUS | Status: DC | PRN
Start: 2021-11-17 — End: 2021-11-17
  Administered 2021-11-17: 160 ug via INTRAVENOUS
  Administered 2021-11-17: 80 ug via INTRAVENOUS
  Administered 2021-11-17 (×3): 160 ug via INTRAVENOUS

## 2021-11-17 MED ORDER — AMLODIPINE BESYLATE 5 MG PO TABS: 5.0000 mg | ORAL_TABLET | Freq: Every day | ORAL | Status: DC

## 2021-11-17 MED ORDER — SUCCINYLCHOLINE CHLORIDE 200 MG/10ML IV SOSY
PREFILLED_SYRINGE | INTRAVENOUS | Status: DC | PRN
Start: 2021-11-17 — End: 2021-11-17
  Administered 2021-11-17: 200 mg via INTRAVENOUS

## 2021-11-17 MED ORDER — FENTANYL CITRATE (PF) 100 MCG/2ML IJ SOLN
25.0000 ug | INTRAMUSCULAR | Status: DC | PRN
  Administered 2021-11-17 (×3): 25 ug via INTRAVENOUS

## 2021-11-17 MED ORDER — ROCURONIUM BROMIDE 100 MG/10ML IV SOLN
INTRAVENOUS | Status: DC | PRN
Start: 2021-11-17 — End: 2021-11-17
  Administered 2021-11-17: 50 mg via INTRAVENOUS

## 2021-11-17 MED ORDER — PROPOFOL 10 MG/ML IV BOLUS
INTRAVENOUS | Status: DC | PRN
  Administered 2021-11-17: 120 mg via INTRAVENOUS

## 2021-11-17 MED ORDER — MIDAZOLAM HCL 2 MG/2ML IJ SOLN
INTRAMUSCULAR | Status: DC | PRN
  Administered 2021-11-17: 2 mg via INTRAVENOUS

## 2021-11-17 MED ORDER — KETOROLAC TROMETHAMINE 30 MG/ML IJ SOLN
30.0000 mg | Freq: Four times a day (QID) | INTRAMUSCULAR | Status: DC
  Administered 2021-11-18 (×2): 30 mg via INTRAVENOUS
  Filled 2021-11-17 (×2): qty 1

## 2021-11-17 SURGICAL SUPPLY — 48 items
CUTTER FLEX LINEAR 45M (STAPLE) ×1 IMPLANT
DERMABOND ADVANCED (GAUZE/BANDAGES/DRESSINGS) ×1
DERMABOND ADVANCED .7 DNX12 (GAUZE/BANDAGES/DRESSINGS) ×1 IMPLANT
ELECT CAUTERY BLADE TIP 2.5 (TIP) ×2
ELECT REM PT RETURN 9FT ADLT (ELECTROSURGICAL) ×2
ELECTRODE CAUTERY BLDE TIP 2.5 (TIP) ×1 IMPLANT
ELECTRODE REM PT RTRN 9FT ADLT (ELECTROSURGICAL) ×1 IMPLANT
GAUZE 4X4 16PLY ~~LOC~~+RFID DBL (SPONGE) ×2 IMPLANT
GLOVE SURG SYN 7.0 (GLOVE) ×2 IMPLANT
GLOVE SURG SYN 7.0 PF PI (GLOVE) ×1 IMPLANT
GLOVE SURG SYN 7.5  E (GLOVE) ×1
GLOVE SURG SYN 7.5 E (GLOVE) ×1 IMPLANT
GLOVE SURG SYN 7.5 PF PI (GLOVE) ×1 IMPLANT
GOWN STRL REUS W/ TWL LRG LVL3 (GOWN DISPOSABLE) ×2 IMPLANT
GOWN STRL REUS W/TWL LRG LVL3 (GOWN DISPOSABLE) ×2
IRRIGATION STRYKERFLOW (MISCELLANEOUS) IMPLANT
IRRIGATOR STRYKERFLOW (MISCELLANEOUS) ×2
IV NS 1000ML (IV SOLUTION) ×1
IV NS 1000ML BAXH (IV SOLUTION) ×1 IMPLANT
KIT TURNOVER KIT A (KITS) ×2 IMPLANT
LABEL OR SOLS (LABEL) ×2 IMPLANT
LIGASURE LAP MARYLAND 5MM 37CM (ELECTROSURGICAL) ×1 IMPLANT
MANIFOLD NEPTUNE II (INSTRUMENTS) ×2 IMPLANT
NEEDLE HYPO 22GX1.5 SAFETY (NEEDLE) ×2 IMPLANT
NS IRRIG 500ML POUR BTL (IV SOLUTION) ×2 IMPLANT
PACK LAP CHOLECYSTECTOMY (MISCELLANEOUS) ×2 IMPLANT
PENCIL ELECTRO HAND CTR (MISCELLANEOUS) ×2 IMPLANT
POUCH SPECIMEN RETRIEVAL 10MM (ENDOMECHANICALS) ×2 IMPLANT
RELOAD 45 VASCULAR/THIN (ENDOMECHANICALS) IMPLANT
RELOAD STAPLE 45 2.5 WHT GRN (ENDOMECHANICALS) IMPLANT
RELOAD STAPLE 45 3.5 BLU ETS (ENDOMECHANICALS) ×1 IMPLANT
RELOAD STAPLE TA45 3.5 REG BLU (ENDOMECHANICALS) ×2 IMPLANT
SCISSORS METZENBAUM CVD 33 (INSTRUMENTS) ×2 IMPLANT
SLEEVE ADV FIXATION 5X100MM (TROCAR) ×2 IMPLANT
SUT MNCRL 4-0 (SUTURE) ×1
SUT MNCRL 4-0 27XMFL (SUTURE) ×1
SUT VIC AB 2-0 SH 27 (SUTURE) ×1
SUT VIC AB 2-0 SH 27XBRD (SUTURE) IMPLANT
SUT VIC AB 3-0 SH 27 (SUTURE) ×1
SUT VIC AB 3-0 SH 27X BRD (SUTURE) IMPLANT
SUT VICRYL 0 AB UR-6 (SUTURE) ×2 IMPLANT
SUTURE MNCRL 4-0 27XMF (SUTURE) ×1 IMPLANT
SYS KII FIOS ACCESS ABD 5X100 (TROCAR) ×2
SYSTEM KII FIOS ACES ABD 5X100 (TROCAR) ×1 IMPLANT
TRAY FOLEY MTR SLVR 16FR STAT (SET/KITS/TRAYS/PACK) ×2 IMPLANT
TROCAR BALLN GELPORT 12X130M (ENDOMECHANICALS) ×2 IMPLANT
TUBING EVAC SMOKE HEATED PNEUM (TUBING) ×2 IMPLANT
WATER STERILE IRR 500ML POUR (IV SOLUTION) ×2 IMPLANT

## 2021-11-17 NOTE — ED Notes (Signed)
Per video Spanish interpreter, J. Joseph Art PA-C explained CT results and plan of care. Patient was agreeable, but states she is a Jehovah Witness and will not accept a blood transfusion. Patient states she can have plasma, but wanted to consult her husband about that. Patient remained calm and cooperative throughout the conversation. Patient is aware of need for a Covid test. Patient agreed to change into hospital gown and was instructed to take off jewelry and give to husband when he arrives. Warm blanket given to patient.

## 2021-11-17 NOTE — Anesthesia Preprocedure Evaluation (Signed)
Anesthesia Evaluation  Patient identified by MRN, date of birth, ID band Patient awake    Reviewed: Allergy & Precautions, H&P , NPO status , Patient's Chart, lab work & pertinent test results, reviewed documented beta blocker date and time   Airway Mallampati: II  TM Distance: >3 FB Neck ROM: full    Dental  (+) Teeth Intact   Pulmonary neg pulmonary ROS,    Pulmonary exam normal        Cardiovascular Exercise Tolerance: Good hypertension, On Medications negative cardio ROS Normal cardiovascular exam Rhythm:regular Rate:Normal     Neuro/Psych Anxiety negative neurological ROS  negative psych ROS   GI/Hepatic negative GI ROS, Neg liver ROS,   Endo/Other  negative endocrine ROS  Renal/GU negative Renal ROS  negative genitourinary   Musculoskeletal   Abdominal   Peds  Hematology negative hematology ROS (+)   Anesthesia Other Findings Past Medical History: No date: Anxiety No date: Hypertension Past Surgical History: No date: CESAREAN SECTION     Comment:  2 No date: TUBAL LIGATION BMI    Body Mass Index: 23.69 kg/m     Reproductive/Obstetrics negative OB ROS                             Anesthesia Physical Anesthesia Plan  ASA: 2  Anesthesia Plan: General ETT   Post-op Pain Management:    Induction:   PONV Risk Score and Plan: 4 or greater  Airway Management Planned:   Additional Equipment:   Intra-op Plan:   Post-operative Plan:   Informed Consent: I have reviewed the patients History and Physical, chart, labs and discussed the procedure including the risks, benefits and alternatives for the proposed anesthesia with the patient or authorized representative who has indicated his/her understanding and acceptance.     Dental Advisory Given  Plan Discussed with: CRNA  Anesthesia Plan Comments:         Anesthesia Quick Evaluation

## 2021-11-17 NOTE — ED Notes (Signed)
Consent for OR procedure reviewed with patient via video interpreter. Son has patient's belongings.

## 2021-11-17 NOTE — ED Notes (Signed)
Patient's purse and clothing are with patient upon transfer to OR.

## 2021-11-17 NOTE — ED Notes (Signed)
Report given to [facility] RN in OR.

## 2021-11-17 NOTE — ED Triage Notes (Addendum)
Triage completed using spanish interpreter [phone]  Pt to ER via POV with complaints of generalized abdominal pain that started last night, describes it as strong and constant around her belly button. Denies NVD. Denies urinary symptoms. No fevers at home.

## 2021-11-17 NOTE — Transfer of Care (Signed)
Immediate Anesthesia Transfer of Care Note  Patient: Betty Vasquez  Procedure(s) Performed: APPENDECTOMY LAPAROSCOPIC  Patient Location: PACU  Anesthesia Type:General  Level of Consciousness: drowsy  Airway & Oxygen Therapy: Patient Spontanous Breathing  Post-op Assessment: Report given to RN  Post vital signs: stable  Last Vitals:  Vitals Value Taken Time  BP 109/73 11/17/21 2154  Temp 36.9 C 11/17/21 2154  Pulse 101 11/17/21 2154  Resp 15 11/17/21 2154  SpO2 97 % 11/17/21 2154  Vitals shown include unvalidated device data.  Last Pain:  Vitals:   11/17/21 2154  TempSrc: Skin  PainSc:          Complications: No notable events documented.

## 2021-11-17 NOTE — Op Note (Signed)
°  Procedure Date:  11/17/2021  Pre-operative Diagnosis:  Acute appendicitis  Post-operative Diagnosis:  Acute appendicitis, umbilical hernia  Procedure:  Laparoscopic appendectomy, open umbilical hernia repair.  Surgeon:  Howie Ill, MD  Anesthesia:  General endotracheal  Estimated Blood Loss:  5 ml  Specimens:  appendix  Complications:  None  Indications for Procedure:  This is a 51 y.o. female who presents with abdominal pain and workup revealing acute appendicitis.  The options of surgery versus observation were reviewed with the patient and/or family. The risks of bleeding, infection, recurrence of symptoms, negative laparoscopy, potential for an open procedure, bowel injury, abscess or infection, were all discussed with the patient and she was willing to proceed.  Description of Procedure: The patient was correctly identified in the preoperative area and brought into the operating room.  The patient was placed supine with VTE prophylaxis in place.  Appropriate time-outs were performed.  Anesthesia was induced and the patient was intubated.  Foley catheter was placed.  Appropriate antibiotics were infused.  The abdomen was prepped and draped in a sterile fashion. The patietn was noted to have an umbilical hernia.  An infraumbilical incision was made. Cautery was used to dissect down the umbilical stalk and to separate it from the fascia, revealing a 7 mm hernia defect.  The fascial edges were cleared and the defect was extended to allow a 12 mm port.  Pneumoperitoneum was obtained with appropriate opening pressures.  Two 5-mm ports were placed in the suprapubic and left lateral positions under direct visualization.  The right lower quadrant was inspected and the appendix was identified and found to be acutely inflamed distally but not perforated.  The appendix was carefully dissected.  The mesoappendix was divided using the LigaSure.  The base of the appendix was dissected out and  divided with a standard load Endo GIA.  The appendix was placed in an Endocatch bag.  The right lower quadrant was then inspected again revealing an intact staple line, no bleeding, and no bowel injury.  The area was thoroughly irrigated.  The 5 mm ports were removed under direct visualization and the Hasson trocar was removed.  The Endocatch bag was brought out through the umbilical incision.  The hernia defect was closed with 0 Vicryl sutures.  Local anesthetic was infused in all incisions.  At that point, it was noted that there was a missing 2-0 Vicryl suture.  Was not found in the surgical field or table with visual inspection.  Xray was called into the room and KUB was obtained.  There was no retained foreign body noted.  We proceeded to continue closing.  The umbilical stalk was reattached to the fascia using 2-0 Vicryl suture, and the umbilical incision was closed using 3-0 Vicryl and 4-0 Monocryl.  The remaining port incisions were closed with 4-0 Monocryl.  The wounds were cleaned and sealed with DermaBond.  Foley catheter was removed and the patient was emerged from anesthesia and extubated and brought to the recovery room for further management.  The patient tolerated the procedure well and all counts were correct at the end of the case.   Howie Ill, MD

## 2021-11-17 NOTE — ED Provider Notes (Signed)
[facility] Provider Note  Patient Contact: 4:52 PM (approximate)   History   Abdominal Pain   HPI  Betty Vasquez is a 51 y.o. female presents to the emergency department with periumbilical and right lower quadrant abdominal pain that started last night.  Patient denies nausea and vomiting.  She states that she has had some loose stools today but no diarrhea.  She states that she has never had pain like this in the past.  She is unsure of fever.  She states that her son is having viral URI-like symptoms.  She denies chest pain, chest tightness or shortness of breath.      Physical Exam   Triage Vital Signs: ED Triage Vitals [11/17/21 1209]  Enc Vitals Group     BP (!) 149/111     Pulse Rate 95     Resp 18     Temp 98.1 F (36.7 C)     Temp Source Oral     SpO2 100 %     Weight 138 lb (62.6 kg)     Height 5\' 4"  (1.626 m)     Head Circumference      Peak Flow      Pain Score 10     Pain Loc      Pain Edu?      Excl. in GC?     Most recent vital signs: Vitals:   11/17/21 1209  BP: (!) 149/111  Pulse: 95  Resp: 18  Temp: 98.1 F (36.7 C)  SpO2: 100%     General: Alert and in no acute distress. Eyes:  PERRL. EOMI. Head: No acute traumatic findings ENT:      Nose: No congestion/rhinnorhea.      Mouth/Throat: Mucous membranes are moist. Neck: No stridor. No cervical spine tenderness to palpation. Cardiovascular:  Good peripheral perfusion Respiratory: Normal respiratory effort without tachypnea or retractions. Lungs CTAB. Good air entry to the bases with no decreased or absent breath sounds. Gastrointestinal: Bowel sounds 4 quadrants.  Patient has right lower quadrant abdominal tenderness with guarding. Musculoskeletal: Full range of motion to all extremities.  Neurologic:  No gross focal neurologic deficits are appreciated.  Skin:   No rash noted Other:   ED Results / Procedures / Treatments   Labs (all labs ordered are  listed, but only abnormal results are displayed) Labs Reviewed  COMPREHENSIVE METABOLIC PANEL - Abnormal; Notable for the following components:      Result Value   Glucose, Bld 122 (*)    All other components within normal limits  CBC - Abnormal; Notable for the following components:   WBC 16.3 (*)    All other components within normal limits  URINALYSIS, ROUTINE W REFLEX MICROSCOPIC - Abnormal; Notable for the following components:   Hgb urine dipstick MODERATE (*)    All other components within normal limits  RESP PANEL BY RT-PCR (FLU A&B, COVID) ARPGX2  LIPASE, BLOOD  URINALYSIS, MICROSCOPIC (REFLEX)  POC URINE PREG, ED       RADIOLOGY  I personally viewed and evaluated these images as part of my medical decision making, as well as reviewing the written report by the radiologist.  ED Provider Interpretation: CT abdomen pelvis findings suggestive of appendicitis.   PROCEDURES:  Critical Care performed: Yes, see critical care procedure note(s)  Procedures   MEDICATIONS ORDERED IN ED: Medications  piperacillin-tazobactam (ZOSYN) IVPB 4.5 g (has no administration in time range)  iohexol (OMNIPAQUE) 350 MG/ML injection 80 mL (  80 mLs Intravenous Contrast Given 11/17/21 1706)     IMPRESSION / MDM / ASSESSMENT AND PLAN / ED COURSE  I reviewed the triage vital signs and the nursing notes.                              Differential diagnosis includes, but is not limited to, appendicitis, ovarian cysts, gastroenteritis, small bowel obstruction, intra-abdominal abscess..  Assessment and Plan:  Appendicitis 51 year old female presents to the emergency department with periumbilical and right lower quadrant abdominal pain that started last night.  Patient was mildly hypertensive at triage but vital signs were otherwise reassuring.  She had right lower quadrant tenderness with guarding on exam.  CT abdomen pelvis results were suggestive of appendicitis.  She had an elevated  white blood cell count.  I reached out to general surgeon on-call, Dr. Aleen Campi who agreed with diagnosis of appendicitis and will evaluate patient at bedside.     FINAL CLINICAL IMPRESSION(S) / ED DIAGNOSES   Final diagnoses:  Acute appendicitis with localized peritonitis, without perforation, abscess, or gangrene     Rx / DC Orders   ED Discharge Orders     None        Note:  This document was prepared using Dragon voice recognition software and may include unintentional dictation errors.   Pia Mau [place], PA-C 11/17/21 1813    Delton [place], MD 11/17/21 [phone]

## 2021-11-17 NOTE — H&P (Signed)
Date of Admission:  11/17/2021  Reason for Admission:  Acute appendicitis  History of Present Illness: Betty Vasquez is a 51 y.o. female presenting with abdominal pain that started yesterday evening.  The pain was initial mild in the lower abdomen and the patient thought that maybe she'd improve after a bowel movement.  Today, the pain continued to worsen and she presented this afternoon to the ER.  Patient denies any fevers at home, chills, chest pain, shortness of breath.  Reports some nausea with heartburn/reflux-type symptom earlier, but no real emesis.  The pain is localized to the lower abdomen slightly to the right of midline.  In the ER, her WBC is elevated to 16.3, with normal renal function and normal LFTs.  However, CT scan of abdomen and pelvis showed acute appendicitis, with a dilated distal appendix with two appendicoliths, with significant stranding at the tip of the appendix.  No free air or abscess.  Of note, the patient reports she had a bite of sweet potato around 5 pm.  She has had two C-sections and a tubal ligation.  Past Medical History: Past Medical History:  Diagnosis Date   Anxiety    Hypertension      Past Surgical History: Past Surgical History:  Procedure Laterality Date   CESAREAN SECTION     2   TUBAL LIGATION      Home Medications: Prior to Admission medications   Medication Sig Start Date End Date Taking? Authorizing Provider  amitriptyline (ELAVIL) 25 MG tablet  07/19/19   [provider]  amLODipine (NORVASC) 10 MG tablet Take 10 mg by mouth daily. 10/14/21   [provider]  amLODipine (NORVASC) 5 MG tablet Take 5 mg by mouth daily. Take 2 tablets by mouth daily    [provider]  DULoxetine (CYMBALTA) 20 MG capsule Take 20 mg by mouth daily. 07/30/21   [provider]  EFFEXOR XR 37.5 MG 24 hr capsule Take 1 capsule by mouth daily. 11/13/21   [provider]  NAPROSYN 500 MG tablet Take by mouth.  11/13/21   [provider]  sertraline (ZOLOFT) 50 MG tablet Take 50 mg by mouth daily.    [provider]    Allergies: Allergies  Allergen Reactions   Codeine Palpitations    Social History:  reports that she has never smoked. She has never used smokeless tobacco. She reports that she does not drink alcohol and does not use drugs.   Family History: Family History  Problem Relation Age of Onset   Hypertension Mother    Breast cancer Neg Hx    Ovarian cancer Neg Hx    Colon cancer Neg Hx    Diabetes Neg Hx     Review of Systems: Review of Systems  Constitutional:  Negative for chills and fever.  HENT:  Negative for hearing loss.   Respiratory:  Negative for shortness of breath.   Cardiovascular:  Negative for chest pain.  Gastrointestinal:  Positive for abdominal pain and nausea. Negative for constipation, diarrhea and vomiting.  Genitourinary:  Negative for dysuria.  Musculoskeletal:  Negative for myalgias.  Skin:  Negative for rash.  Neurological:  Negative for dizziness.  Psychiatric/Behavioral:  Negative for depression.    Physical Exam BP (!) 149/111    Pulse 95    Temp 98.1 F (36.7 C) (Oral)    Resp 18    Ht 5\' 4"  (1.626 m)    Wt 62.6 kg    LMP 11/13/2021  SpO2 100%    BMI 23.69 kg/m  CONSTITUTIONAL: No acute distress HEENT:  Normocephalic, atraumatic, extraocular motion intact. NECK: Trachea is midline, and there is no jugular venous distension.  RESPIRATORY:  Normal respiratory effort without pathologic use of accessory muscles. CARDIOVASCULAR:  Regular rhythm and rate. GI: The abdomen is soft, non-distended, with tenderness to palpation in the lower abdomen, just to the right of midline consistent with her acute appendicitis.  No diffuse peritonitis.  MUSCULOSKELETAL:  Normal muscle strength and tone in all four extremities.  No peripheral edema or cyanosis. SKIN: Skin turgor is normal. There are no pathologic skin lesions.  NEUROLOGIC:   Motor and sensation is grossly normal.  Cranial nerves are grossly intact. PSYCH:  Alert and oriented to person, place and time. Affect is normal.  Laboratory Analysis: Results for orders placed or performed during the hospital encounter of 11/17/21 (from the past 24 hour(s))  Lipase, blood     Status: None   Collection Time: 11/17/21 12:10 PM  Result Value Ref Range   Lipase 39 11 - 51 U/L  Comprehensive metabolic panel     Status: Abnormal   Collection Time: 11/17/21 12:10 PM  Result Value Ref Range   Sodium 137 135 - 145 mmol/L   Potassium 3.9 3.5 - 5.1 mmol/L   Chloride 105 98 - 111 mmol/L   CO2 26 22 - 32 mmol/L   Glucose, Bld 122 (H) 70 - 99 mg/dL   BUN 13 6 - 20 mg/dL   Creatinine, Ser 7.79 0.44 - 1.00 mg/dL   Calcium 9.4 8.9 - 39.0 mg/dL   Total Protein 7.8 6.5 - 8.1 g/dL   Albumin 4.5 3.5 - 5.0 g/dL   AST 18 15 - 41 U/L   ALT 15 0 - 44 U/L   Alkaline Phosphatase 46 38 - 126 U/L   Total Bilirubin 0.5 0.3 - 1.2 mg/dL   GFR, Estimated >30 >09 mL/min   Anion gap 6 5 - 15  CBC     Status: Abnormal   Collection Time: 11/17/21 12:10 PM  Result Value Ref Range   WBC 16.3 (H) 4.0 - 10.5 K/uL   RBC 4.39 3.87 - 5.11 MIL/uL   Hemoglobin 13.5 12.0 - 15.0 g/dL   HCT 23.3 00.7 - 62.2 %   MCV 90.9 80.0 - 100.0 fL   MCH 30.8 26.0 - 34.0 pg   MCHC 33.8 30.0 - 36.0 g/dL   RDW 63.3 35.4 - 56.2 %   Platelets 310 150 - 400 K/uL   nRBC 0.0 0.0 - 0.2 %  Urinalysis, Routine w reflex microscopic Urine, Clean Catch     Status: Abnormal   Collection Time: 11/17/21 12:10 PM  Result Value Ref Range   Color, Urine YELLOW YELLOW   APPearance CLEAR CLEAR   Specific Gravity, Urine 1.020 1.005 - 1.030   pH 6.5 5.0 - 8.0   Glucose, UA NEGATIVE NEGATIVE mg/dL   Hgb urine dipstick MODERATE (A) NEGATIVE   Bilirubin Urine NEGATIVE NEGATIVE   Ketones, ur NEGATIVE NEGATIVE mg/dL   Protein, ur NEGATIVE NEGATIVE mg/dL   Nitrite NEGATIVE NEGATIVE   Leukocytes,Ua NEGATIVE NEGATIVE  Urinalysis,  Microscopic (reflex)     Status: None   Collection Time: 11/17/21 12:10 PM  Result Value Ref Range   RBC / HPF 0-5 0 - 5 RBC/hpf   WBC, UA 0-5 0 - 5 WBC/hpf   Bacteria, UA NONE SEEN NONE SEEN   Squamous Epithelial / LPF 0-5 0 - 5  Mucus PRESENT   POC urine preg, ED     Status: None   Collection Time: 11/17/21  1:37 PM  Result Value Ref Range   Preg Test, Ur NEGATIVE NEGATIVE    Imaging: CT ABDOMEN PELVIS W CONTRAST  Result Date: 11/17/2021 CLINICAL DATA:  Paraumbilical abdominal pain beginning last night. EXAM: CT ABDOMEN AND PELVIS WITH CONTRAST TECHNIQUE: Multidetector CT imaging of the abdomen and pelvis was performed using the standard protocol following bolus administration of intravenous contrast. RADIATION DOSE REDUCTION: This exam was performed according to the departmental dose-optimization program which includes automated exposure control, adjustment of the mA and/or kV according to patient size and/or use of iterative reconstruction technique. CONTRAST:  19mL OMNIPAQUE IOHEXOL 350 MG/ML SOLN COMPARISON:  None. FINDINGS: Lower Chest: No acute findings. Hepatobiliary: No hepatic masses identified. Small left hepatic lobe cyst noted. Gallbladder is unremarkable. No evidence of biliary ductal dilatation. Pancreas:  No mass or inflammatory changes. Spleen: Within normal limits in size and appearance. Adrenals/Urinary Tract: No masses identified. A few tiny sub-cm renal cysts are noted. No evidence of ureteral calculi or hydronephrosis. Stomach/Bowel:  Findings of acute appendicitis are seen as follows: Appendix: Location- Standard, with mild periappendiceal inflammatory changes Diameter-12 mm Appendicolith- Present Mucosal hyper-enhancement-mild Extraluminal Gas- Absent Periappendiceal Collection- None Vascular/Lymphatic: No pathologically enlarged lymph nodes. No acute vascular findings. Reproductive:  No mass or other significant abnormality. Other:  None. Musculoskeletal:  No suspicious  bone lesions identified. IMPRESSION: Positive for acute appendicitis. No evidence of abscess or other complication. Electronically Signed   By: Danae Orleans M.D.   On: 11/17/2021 17:29    Assessment and Plan: This is a 51 y.o. female with acute appendicitis.  --The patient reports having small bite of sweet potato around 5 pm.  Trying account for NPO status of 8 hrs, my concern is that the distal portion of the appendix is dilated, with significant stranding and an appendicolith, which would put her at higher risk of perforation if we were to wait 8 hrs prior to surgery.  As such, would declare this emergency/urgency to proceed now pending OR/anesthesia availability rather than in 8 hrs.   --Discussed with the patient the findings with her labs and CT scan.  Discussed with her the plan for laparoscopic appendectomy rather than trying to treat this conservatively with antibiotics alone give the above findings with the distal inflammation and appendicolith.  Reviewed the surgery at length with her including the risks of bleeding, infection, injury to surrounding structures, possibility of percutaneous drain, hospital stay, antibiotics, and she's willing to proceed.  Patient will be admitted to surgical team.  She will be NPO, with IV fluids, will get started on IV Zosyn.  COVID test currently pending. --Patient understands this plan and all of her questions have been answered.  I spent 75 minutes dedicated to the care of this patient on the date of this encounter to include pre-visit review of records, face-to-face time with the patient discussing diagnosis and management, and any post-visit coordination of care.   Howie Ill, MD [facility] Pg:  [phone]

## 2021-11-17 NOTE — Anesthesia Postprocedure Evaluation (Signed)
Anesthesia Post Note  Patient: Betty Vasquez  Procedure(s) Performed: APPENDECTOMY LAPAROSCOPIC  Patient location during evaluation: PACU Anesthesia Type: General and Bier Block Respiratory status: spontaneous breathing Anesthetic complications: no   No notable events documented.   Last Vitals:  Vitals:   11/17/21 2154 11/17/21 2155  BP: 109/73   Pulse: 96 (!) 101  Resp: 18 15  Temp: 36.9 C   SpO2: 100% 97%    Last Pain:  Vitals:   11/17/21 2154  TempSrc: Skin  PainSc:                  Tresa Endo Nicolaos Mitrano

## 2021-11-18 ENCOUNTER — Encounter: Payer: Self-pay | Admitting: Surgery

## 2021-11-18 DIAGNOSIS — K353 Acute appendicitis with localized peritonitis, without perforation or gangrene: Secondary | ICD-10-CM | POA: Diagnosis not present

## 2021-11-18 LAB — BASIC METABOLIC PANEL
Anion gap: 7 (ref 5–15)
BUN: 9 mg/dL (ref 6–20)
CO2: 24 mmol/L (ref 22–32)
Calcium: 8.8 mg/dL — ABNORMAL LOW (ref 8.9–10.3)
Chloride: 106 mmol/L (ref 98–111)
Creatinine, Ser: 0.79 mg/dL (ref 0.44–1.00)
GFR, Estimated: >60 mL/min (ref >60)
Glucose, Bld: 154 mg/dL — ABNORMAL HIGH (ref 70–99)
Potassium: 3.8 mmol/L (ref 3.5–5.1)
Sodium: 137 mmol/L (ref 135–145)

## 2021-11-18 LAB — CBC
HCT: 33 % — ABNORMAL LOW (ref 36.0–46.0)
Hemoglobin: 11.6 g/dL — ABNORMAL LOW (ref 12.0–15.0)
MCH: 31.8 pg (ref 26.0–34.0)
MCHC: 35.2 g/dL (ref 30.0–36.0)
MCV: 90.4 fL (ref 80.0–100.0)
Platelets: 255 10*3/uL (ref 150–400)
RBC: 3.65 MIL/uL — ABNORMAL LOW (ref 3.87–5.11)
RDW: 12.1 % (ref 11.5–15.5)
WBC: 13.8 10*3/uL — ABNORMAL HIGH (ref 4.0–10.5)
nRBC: 0 % (ref 0.0–0.2)

## 2021-11-18 LAB — HIV ANTIBODY (ROUTINE TESTING W REFLEX): HIV Screen 4th Generation wRfx: NONREACTIVE

## 2021-11-18 LAB — MAGNESIUM: Magnesium: 1.9 mg/dL (ref 1.7–2.4)

## 2021-11-18 MED ORDER — OXYCODONE HCL 5 MG PO TABS: 5.0000 mg | ORAL_TABLET | Freq: Four times a day (QID) | ORAL | 0 refills | Status: DC | PRN

## 2021-11-18 MED ORDER — AMOXICILLIN-POT CLAVULANATE 875-125 MG PO TABS
1.0000 | ORAL_TABLET | Freq: Two times a day (BID) | ORAL | 0 refills | Status: AC
Start: 2021-11-18 — End: 2021-11-25

## 2021-11-18 MED ORDER — IBUPROFEN 600 MG PO TABS: 600.0000 mg | ORAL_TABLET | Freq: Four times a day (QID) | ORAL | 0 refills | Status: DC | PRN

## 2021-11-18 NOTE — Discharge Summary (Signed)
[facility] SURGICAL DISCHARGE SUMMARY  Patient ID: Betty Vasquez MRN: [id number] DOB/AGE: [birth year] 51 y.o.  Admit date: 11/17/2021 Discharge date: 11/18/2021  Discharge Diagnoses Patient Active Problem List   Diagnosis Date Noted   Acute appendicitis 11/17/2021    Consultants None  Procedures 11/17/2021 Laparoscopic Appendectomy  HPI: Betty Vasquez is a 51 y.o. female presenting with abdominal pain that started yesterday evening.  The pain was initial mild in the lower abdomen and the patient thought that maybe she'd improve after a bowel movement.  Today, the pain continued to worsen and she presented this afternoon to the ER.  Patient denies any fevers at home, chills, chest pain, shortness of breath.  Reports some nausea with heartburn/reflux-type symptom earlier, but no real emesis.  The pain is localized to the lower abdomen slightly to the right of midline.  In the ER, her WBC is elevated to 16.3, with normal renal function and normal LFTs.  However, CT scan of abdomen and pelvis showed acute appendicitis, with a dilated distal appendix with two appendicoliths, with significant stranding at the tip of the appendix.  No free air or abscess.  Hospital Course: Informed consent was obtained and documented, and patient underwent uneventful laparoscopic appendectomy (Dr Hampton Abbot, 11/17/2021).  Post-operatively, patient's pain/symptoms improved/resolved and advancement of patient's diet and ambulation were well-tolerated. The remainder of patient's hospital course was essentially unremarkable, and discharge planning was initiated accordingly with patient safely able to be discharged home with appropriate discharge instructions, antibiotics (Augmentin x7 days), pain control, and outpatient follow-up after all of her questions were answered to her expressed satisfaction.   Discharge Condition: Good   Physical Examination:  Constitutional: Well appearing female,  NAD Pulmonary: Normal effort; no respiratory distress Gastrointestinal: Soft, mild incisional soreness, non-distended, no rebound/guarding Skin: Laparoscopic incisions are CDI with dermabond, no erythema    Allergies as of 11/18/2021       Reactions   Codeine Palpitations        Medication List     TAKE these medications    amitriptyline 25 MG tablet Commonly known as: ELAVIL   amLODipine 5 MG tablet Commonly known as: NORVASC Take 5 mg by mouth daily. Take 2 tablets by mouth daily   amLODipine 10 MG tablet Commonly known as: NORVASC Take 10 mg by mouth daily.   amoxicillin-clavulanate 875-125 MG tablet Commonly known as: Augmentin Take 1 tablet by mouth 2 (two) times daily for 7 days.   DULoxetine 20 MG capsule Commonly known as: CYMBALTA Take 20 mg by mouth daily.   Effexor XR 37.5 MG 24 hr capsule Generic drug: venlafaxine XR Take 1 capsule by mouth daily.   ibuprofen 600 MG tablet Commonly known as: ADVIL Take 1 tablet (600 mg total) by mouth every 6 (six) hours as needed.   Naprosyn 500 MG tablet Generic drug: naproxen Take by mouth.   oxyCODONE 5 MG immediate release tablet Commonly known as: Oxy IR/ROXICODONE Take 1 tablet (5 mg total) by mouth every 6 (six) hours as needed for severe pain or breakthrough pain.   sertraline 50 MG tablet Commonly known as: ZOLOFT Take 50 mg by mouth daily.          Follow-up Information     Piscoya, Betty Bath, MD. Schedule an appointment as soon as possible for a visit in 2 week(s).   Specialty: General Surgery Why: s/p lap appy Contact information: [address] [place] [address] [phone]  Time spent on discharge management including discussion of hospital course, clinical condition, outpatient instructions, prescriptions, and follow up with the patient and members of the medical team: >30 minutes  -- Edison Simon , PA-C [facility]   11/18/2021, 9:00 AM [phone] M-F: 7am - 4pm

## 2021-11-18 NOTE — Discharge Instructions (Signed)
In addition to included general post-operative instruction,  Diet: Resume home diet.   Activity: No heavy lifting >20 pounds (children, pets, laundry, garbage) or strenuous activity for 4 weeks, but light activity and walking are encouraged. Do not drive or drink alcohol if taking narcotic pain medications or having pain that might distract from driving.  Wound care: 2 days after surgery (01/17), you may shower/get incision wet with soapy water and pat dry (do not rub incisions), but no baths or submerging incision underwater until follow-up.   Medications: Resume all home medications. For mild to moderate pain: acetaminophen (Tylenol) or ibuprofen/naproxen (if no kidney disease). Combining Tylenol with alcohol can substantially increase your risk of causing liver disease. Narcotic pain medications, if prescribed, can be used for severe pain, though may cause nausea, constipation, and drowsiness. Do not combine Tylenol and Percocet (or similar) within a 6 hour period as Percocet (and similar) contain(s) Tylenol. If you do not need the narcotic pain medication, you do not need to fill the prescription.  Call office [phone] / [phone]) at any time if any questions, worsening pain, fevers/chills, bleeding, drainage from incision site, or other concerns.

## 2021-11-18 NOTE — Progress Notes (Signed)
Patient being discharged. Discharge instructions reviewed with patient, Interpreter services used Byrd Hesselbach). All medications and restrictions reviewed with patient. Patient verbalized full understanding of all education. Patients son is her ride home.

## 2021-11-19 LAB — SURGICAL PATHOLOGY

## 2021-12-03 ENCOUNTER — Ambulatory Visit (INDEPENDENT_AMBULATORY_CARE_PROVIDER_SITE_OTHER): Payer: Medicare Other | Admitting: Physician Assistant

## 2021-12-03 ENCOUNTER — Other Ambulatory Visit: Payer: Self-pay

## 2021-12-03 ENCOUNTER — Encounter: Payer: Self-pay | Admitting: Physician Assistant

## 2021-12-03 VITALS — BP 159/93 | HR 99 | Temp 99.0°F | Ht 64.0 in | Wt 139.0 lb

## 2021-12-03 DIAGNOSIS — K3580 Unspecified acute appendicitis: Secondary | ICD-10-CM

## 2021-12-03 DIAGNOSIS — Z09 Encounter for follow-up examination after completed treatment for conditions other than malignant neoplasm: Secondary | ICD-10-CM

## 2021-12-03 DIAGNOSIS — K429 Umbilical hernia without obstruction or gangrene: Secondary | ICD-10-CM

## 2021-12-03 MED ORDER — FLUCONAZOLE 150 MG PO TABS: 150.0000 mg | ORAL_TABLET | Freq: Every day | ORAL | 0 refills | Status: DC

## 2021-12-03 NOTE — Patient Instructions (Addendum)
° °  Please pick up your medication at the pharmacy.  GENERAL POST-OPERATIVE PATIENT INSTRUCTIONS   WOUND CARE INSTRUCTIONS:  Keep a dry clean dressing on the wound if there is drainage. The initial bandage may be removed after 24 hours.  Once the wound has quit draining you may leave it open to air.  If clothing rubs against the wound or causes irritation and the wound is not draining you may cover it with a dry dressing during the daytime.  Try to keep the wound dry and avoid ointments on the wound unless directed to do so.  If the wound becomes bright red and painful or starts to drain infected material that is not clear, please contact your physician immediately.  If the wound is mildly pink and has a thick firm ridge underneath it, this is normal, and is referred to as a healing ridge.  This will resolve over the next 4-6 weeks.  BATHING: You may shower if you have been informed of this by your surgeon. However, Please do not submerge in a tub, hot tub, or pool until incisions are completely sealed or have been told by your surgeon that you may do so.  DIET:  You may eat any foods that you can tolerate.  It is a good idea to eat a high fiber diet and take in plenty of fluids to prevent constipation.  If you do become constipated you may want to take a mild laxative or take ducolax tablets on a daily basis until your bowel habits are regular.  Constipation can be very uncomfortable, along with straining, after recent surgery.  ACTIVITY:  You are encouraged to cough and deep breath or use your incentive spirometer if you were given one, every 15-30 minutes when awake.  This will help prevent respiratory complications and low grade fevers post-operatively if you had a general anesthetic.  You may want to hug a pillow when coughing and sneezing to add additional support to the surgical area, if you had abdominal or chest surgery, which will decrease pain during these times.  You are encouraged to walk  and engage in light activity for the next two weeks.  You should not lift more than 20 pounds, until 12/29/2021 as it could put you at increased risk for complications.  Twenty pounds is roughly equivalent to a plastic bag of groceries. At that time- Listen to your body when lifting, if you have pain when lifting, stop and then try again in a few days. Soreness after doing exercises or activities of daily living is normal as you get back in to your normal routine.  MEDICATIONS:  Try to take narcotic medications and anti-inflammatory medications, such as tylenol, ibuprofen, naprosyn, etc., with food.  This will minimize stomach upset from the medication.  Should you develop nausea and vomiting from the pain medication, or develop a rash, please discontinue the medication and contact your physician.  You should not drive, make important decisions, or operate machinery when taking narcotic pain medication.  SUNBLOCK Use sun block to incision area over the next year if this area will be exposed to sun. This helps decrease scarring and will allow you avoid a permanent darkened area over your incision.  QUESTIONS:  Please feel free to call our office if you have any questions, and we will be glad to assist you. [phone]

## 2021-12-03 NOTE — Progress Notes (Signed)
[facility] POST-OP OFFICE VISIT  12/03/2021  HPI: Betty Vasquez is a 51 y.o. female 16 days s/p laparoscopic appendectomy and umbilical hernia repair with Dr Aleen Campi.   She is overall doing well; only needing a few ibuprofen for pain No fever, chills, nausea, emesis, or bowel changes She is having some vaginal itching and burning with the Abx; no discharge, no urinary complaints. She has a remote history of yeast infection with Abx No issues with the incisions No other complaints  Vital signs: BP (!) 159/93    Pulse 99    Temp 99 F (37.2 C) (Oral)    Ht 5\' 4"  (1.626 m)    Wt 139 lb (63 kg)    LMP 11/13/2021    SpO2 100%    BMI 23.86 kg/m    Physical Exam: Constitutional: Well appearing female, NAD Abdomen: Soft, non-tender, non-distended, no rebound/guarding Skin: Laparoscopic incisions are healing well, no erythema or drainage   Assessment/Plan: This is a 51 y.o. female 16 days s/p laparoscopic appendectomy and umbilical hernia repair   - Will send Diflucan for likely yeast infection; She understands to call if this does not improve   - Pain control prn  - Reviewed wound care recommendation  - Reviewed lifting restrictions; 6 weeks total  - Reviewed surgical pathology; Appendicitis  - She can follow up on as needed basis; She understands to call with questions/concerns  -- 54, PA-C [facility] 12/03/2021, 1:43 PM [phone] M-F: 7am - 4pm

## 2022-01-22 ENCOUNTER — Other Ambulatory Visit: Payer: Self-pay

## 2022-01-22 ENCOUNTER — Ambulatory Visit
Admission: RE | Admit: 2022-01-22 | Discharge: 2022-01-22 | Disposition: A | Discharge status: Home / Self Care | Payer: BLUE CROSS/BLUE SHIELD | Source: Ambulatory Visit | Attending: Obstetrics and Gynecology | Admitting: Obstetrics and Gynecology

## 2022-01-22 DIAGNOSIS — Z1231 Encounter for screening mammogram for malignant neoplasm of breast: Secondary | ICD-10-CM | POA: Insufficient documentation

## 2022-01-22 DIAGNOSIS — Z01419 Encounter for gynecological examination (general) (routine) without abnormal findings: Secondary | ICD-10-CM | POA: Diagnosis present

## 2022-10-13 ENCOUNTER — Encounter: Payer: Self-pay | Admitting: Obstetrics and Gynecology

## 2022-10-14 ENCOUNTER — Encounter: Payer: Self-pay | Admitting: Obstetrics and Gynecology

## 2022-10-14 ENCOUNTER — Ambulatory Visit (INDEPENDENT_AMBULATORY_CARE_PROVIDER_SITE_OTHER): Payer: Medicare Other | Admitting: Obstetrics and Gynecology

## 2022-10-14 VITALS — BP 156/90 | HR 94 | Ht 64.0 in | Wt 144.3 lb

## 2022-10-14 DIAGNOSIS — Z1231 Encounter for screening mammogram for malignant neoplasm of breast: Secondary | ICD-10-CM

## 2022-10-14 DIAGNOSIS — Z01419 Encounter for gynecological examination (general) (routine) without abnormal findings: Secondary | ICD-10-CM

## 2022-10-14 NOTE — Progress Notes (Signed)
HPI:      [name] is a 51 y.o. [id number] who LMP was Patient's last menstrual period was 10/06/2022 (exact date).  Subjective:   She presents today for her annual examination.  She has no complaints.  She reports she is having normal regular periods.  She is not having hot flashes or other signs of menopause.  She is up-to-date on her Pap smear.  She is due for mammography. She has a tubal ligation for birth control.    Hx: The following portions of the patient's history were reviewed and updated as appropriate:             She  has a past medical history of Anxiety and Hypertension. She does not have any pertinent problems on file. She  has a past surgical history that includes Cesarean section; Tubal ligation; and laparoscopic appendectomy (N/A, 11/17/2021). Her family history includes Hypertension in her mother. She  reports that she has never smoked. She has never used smokeless tobacco. She reports that she does not drink alcohol and does not use drugs. She has a current medication list which includes the following prescription(s): amitriptyline, duloxetine, effexor xr, hydrochlorothiazide, ibuprofen, losartan, naprosyn, amlodipine, and amlodipine. She is allergic to codeine.       Review of Systems:  Review of Systems  Constitutional: Denied constitutional symptoms, night sweats, recent illness, fatigue, fever, insomnia and weight loss.  Eyes: Denied eye symptoms, eye pain, photophobia, vision change and visual disturbance.  Ears/Nose/Throat/Neck: Denied ear, nose, throat or neck symptoms, hearing loss, nasal discharge, sinus congestion and sore throat.  Cardiovascular: Denied cardiovascular symptoms, arrhythmia, chest pain/pressure, edema, exercise intolerance, orthopnea and palpitations.  Respiratory: Denied pulmonary symptoms, asthma, pleuritic pain, productive sputum, cough, dyspnea and wheezing.  Gastrointestinal: Denied, gastro-esophageal reflux, melena, nausea and  vomiting.  Genitourinary: Denied genitourinary symptoms including symptomatic vaginal discharge, pelvic relaxation issues, and urinary complaints.  Musculoskeletal: Denied musculoskeletal symptoms, stiffness, swelling, muscle weakness and myalgia.  Dermatologic: Denied dermatology symptoms, rash and scar.  Neurologic: Denied neurology symptoms, dizziness, headache, neck pain and syncope.  Psychiatric: Denied psychiatric symptoms, anxiety and depression.  Endocrine: Denied endocrine symptoms including hot flashes and night sweats.   Meds:   Current Outpatient Medications on File Prior to Visit  Medication Sig Dispense Refill   amitriptyline (ELAVIL) 25 MG tablet      DULoxetine (CYMBALTA) 20 MG capsule Take 20 mg by mouth daily.     EFFEXOR XR 37.5 MG 24 hr capsule Take 1 capsule by mouth daily.     hydrochlorothiazide (HYDRODIURIL) 25 MG tablet Take 25 mg by mouth daily.     ibuprofen (ADVIL) 600 MG tablet Take 1 tablet (600 mg total) by mouth every 6 (six) hours as needed. 30 tablet 0   losartan (COZAAR) 50 MG tablet Take 50 mg by mouth daily.     NAPROSYN 500 MG tablet Take by mouth.     amLODipine (NORVASC) 10 MG tablet Take 10 mg by mouth daily. (Patient not taking: Reported on 10/14/2022)     amLODipine (NORVASC) 5 MG tablet Take 5 mg by mouth daily. Take 2 tablets by mouth daily (Patient not taking: Reported on 10/14/2022)     No current facility-administered medications on file prior to visit.     Objective:     Vitals:   10/14/22 0922  BP: (!) 156/90  Pulse: 94    Filed Weights   10/14/22 0922  Weight: 144 lb 4.8 oz (65.5 kg)  Physical examination General NAD, Conversant  HEENT Atraumatic; Op clear with mmm.  Normo-cephalic. Pupils reactive. Anicteric sclerae  Thyroid/Neck Smooth without nodularity or enlargement. Normal ROM.  Neck Supple.  Skin No rashes, lesions or ulceration. Normal palpated skin turgor. No nodularity.  Breasts: No masses or  discharge.  Symmetric.  No axillary adenopathy.  Lungs: Clear to auscultation.No rales or wheezes. Normal Respiratory effort, no retractions.  Heart: NSR.  No murmurs or rubs appreciated. No periferal edema  Abdomen: Soft.  Non-tender.  No masses.  No HSM. No hernia  Extremities: Moves all appropriately.  Normal ROM for age. No lymphadenopathy.  Neuro: Oriented to PPT.  Normal mood. Normal affect.     Pelvic:   Vulva: Normal appearance.  No lesions.  Vagina: No lesions or abnormalities noted.  Support: Normal pelvic support.  Urethra No masses tenderness or scarring.  Meatus Normal size without lesions or prolapse.  Cervix: Normal appearance.  No lesions.  Anus: Normal exam.  No lesions.  Perineum: Normal exam.  No lesions.        Bimanual   Uterus: Normal size.  Non-tender.  Mobile.  AV.  Adnexae: No masses.  Non-tender to palpation.  Cul-de-sac: Negative for abnormality.     Assessment:    G2P2002 Patient Active Problem List   Diagnosis Date Noted   Acute appendicitis [id number]   Umbilical hernia without obstruction and without gangrene    Elevated blood pressure reading 08/04/2018   History of tubal ligation 04/27/2017   History of 2 cesarean sections 04/27/2017     1. Encounter for well woman exam with routine gynecological exam   2. Screening mammogram for breast cancer     Normal exam   Plan:            1.  Basic Screening Recommendations The basic screening recommendations for asymptomatic women were discussed with the patient during her visit.  The age-appropriate recommendations were discussed with her and the rational for the tests reviewed.  When I am informed by the patient that another primary care physician has previously obtained the age-appropriate tests and they are up-to-date, only outstanding tests are ordered and referrals given as necessary.  Abnormal results of tests will be discussed with her when all of her results are completed.  Routine  preventative health maintenance measures emphasized: Exercise/Diet/Weight control, Tobacco Warnings, Alcohol/Substance use risks and Stress Management Mammogram ordered 2..  Discussed future menopausal signs and symptoms Orders Orders Placed This Encounter  Procedures   MM DIGITAL SCREENING BILATERAL    No orders of the defined types were placed in this encounter.         F/U  Return in about 1 year (around 10/15/2023) for Annual Physical.  Finis Bud, M.D. 10/14/2022 9:57 AM

## 2022-10-14 NOTE — Progress Notes (Signed)
Patients presents for annual exam today. She states doing well, having regular cycles. Up to date on pap smear.  Patient is due for mammogram, ordered. Annual labs are ordered.  She states no other questions or concerns at this time.

## 2022-10-14 NOTE — Addendum Note (Signed)
Addended by: Georgiana Shore R on: 10/14/2022 10:01 AM   Modules accepted: Orders

## 2022-10-15 LAB — CBC
Hematocrit: 39.8 % (ref 34.0–46.6)
Hemoglobin: 13.1 g/dL (ref 11.1–15.9)
MCH: 31.3 pg (ref 26.6–33.0)
MCHC: 32.9 g/dL (ref 31.5–35.7)
MCV: 95 fL (ref 79–97)
Platelets: 287 10*3/uL (ref 150–450)
RBC: 4.19 x10E6/uL (ref 3.77–5.28)
RDW: 12.2 % (ref 11.7–15.4)
WBC: 6.4 10*3/uL (ref 3.4–10.8)

## 2022-10-15 LAB — BASIC METABOLIC PANEL
BUN/Creatinine Ratio: 14 (ref 9–23)
BUN: 12 mg/dL (ref 6–24)
CO2: 25 mmol/L (ref 20–29)
Calcium: 9.5 mg/dL (ref 8.7–10.2)
Chloride: 100 mmol/L (ref 96–106)
Creatinine, Ser: 0.86 mg/dL (ref 0.57–1.00)
Glucose: 100 mg/dL — ABNORMAL HIGH (ref 70–99)
Potassium: 3.8 mmol/L (ref 3.5–5.2)
Sodium: 139 mmol/L (ref 134–144)
eGFR: 83 mL/min/{1.73_m2} (ref >59)

## 2022-10-15 LAB — LIPID PANEL
Chol/HDL Ratio: 4.1 ratio (ref 0.0–4.4)
Cholesterol, Total: 233 mg/dL — ABNORMAL HIGH (ref 100–199)
HDL: 57 mg/dL (ref >39)
LDL Chol Calc (NIH): 148 mg/dL — ABNORMAL HIGH (ref 0–99)
Triglycerides: 154 mg/dL — ABNORMAL HIGH (ref 0–149)
VLDL Cholesterol Cal: 28 mg/dL (ref 5–40)

## 2022-10-15 LAB — HEMOGLOBIN A1C
Est. average glucose Bld gHb Est-mCnc: 108 mg/dL
Hgb A1c MFr Bld: 5.4 % (ref 4.8–5.6)

## 2022-10-15 LAB — TSH: TSH: 1.6 u[IU]/mL (ref 0.450–4.500)

## 2022-12-03 ENCOUNTER — Other Ambulatory Visit: Payer: Self-pay | Admitting: Nurse Practitioner

## 2022-12-03 DIAGNOSIS — Z1231 Encounter for screening mammogram for malignant neoplasm of breast: Secondary | ICD-10-CM

## 2022-12-22 ENCOUNTER — Other Ambulatory Visit: Payer: Self-pay

## 2022-12-22 ENCOUNTER — Emergency Department
Admission: EM | Admit: 2022-12-22 | Discharge: 2022-12-22 | Disposition: A | Discharge status: Home / Self Care | Payer: Medicare Other | Attending: Emergency Medicine | Admitting: Emergency Medicine

## 2022-12-22 ENCOUNTER — Encounter: Payer: Self-pay | Admitting: *Deleted

## 2022-12-22 DIAGNOSIS — R059 Cough, unspecified: Secondary | ICD-10-CM | POA: Insufficient documentation

## 2022-12-22 DIAGNOSIS — Z1152 Encounter for screening for COVID-19: Secondary | ICD-10-CM | POA: Diagnosis not present

## 2022-12-22 DIAGNOSIS — R0981 Nasal congestion: Secondary | ICD-10-CM | POA: Insufficient documentation

## 2022-12-22 DIAGNOSIS — Z20822 Contact with and (suspected) exposure to covid-19: Secondary | ICD-10-CM | POA: Diagnosis not present

## 2022-12-22 DIAGNOSIS — J029 Acute pharyngitis, unspecified: Secondary | ICD-10-CM | POA: Diagnosis not present

## 2022-12-22 LAB — RESP PANEL BY RT-PCR (RSV, FLU A&B, COVID)  RVPGX2
Influenza A by PCR: NEGATIVE
Influenza B by PCR: NEGATIVE
Resp Syncytial Virus by PCR: NEGATIVE
SARS Coronavirus 2 by RT PCR: NEGATIVE

## 2022-12-22 LAB — GROUP A STREP BY PCR: Group A Strep by PCR: NOT DETECTED

## 2022-12-22 MED ORDER — LIDOCAINE VISCOUS HCL 2 % MT SOLN
15.0000 mL | OROMUCOSAL | 0 refills | Status: AC | PRN
Start: 2022-12-22 — End: 2022-12-29

## 2022-12-22 NOTE — ED Triage Notes (Addendum)
Pt reports sore throat.  Pt also has a cough.   No fever sx for 2 days   pt alert  speech clear.  Interpreter on a stick used in triage.

## 2022-12-22 NOTE — ED Provider Notes (Signed)
[facility] Provider Note  Patient Contact: 9:11 PM (approximate)   History   Sore Throat   HPI  Betty Vasquez is a 52 y.o. female presents to the emergency department with pharyngitis, cough and nasal congestion for the past 2 days.  No chest pain, chest tightness or shortness of breath.  No sick contacts in the home with similar symptoms.  No recent travel.  No difficulty maintaining her own secretions.      Physical Exam   Triage Vital Signs: ED Triage Vitals [12/22/22 1927]  Enc Vitals Group     BP (!) 155/103     Pulse Rate 93     Resp 20     Temp 98.9 F (37.2 C)     Temp Source Oral     SpO2 100 %     Weight      Height      Head Circumference      Peak Flow      Pain Score      Pain Loc      Pain Edu?      Excl. in [place]?     Most recent vital signs: Vitals:   12/22/22 1927  BP: (!) 155/103  Pulse: 93  Resp: 20  Temp: 98.9 F (37.2 C)  SpO2: 100%    Constitutional: Alert and oriented. Patient is lying supine. Eyes: Conjunctivae are normal. PERRL. EOMI. Head: Atraumatic. ENT:      Ears: Tympanic membranes are mildly injected with mild effusion bilaterally.       Nose: No congestion/rhinnorhea.      Mouth/Throat: Mucous membranes are moist. Posterior pharynx is mildly erythematous.  Hematological/Lymphatic/Immunilogical: No cervical lymphadenopathy.  Cardiovascular: Normal rate, regular rhythm. Normal S1 and S2.  Good peripheral circulation. Respiratory: Normal respiratory effort without tachypnea or retractions. Lungs CTAB. Good air entry to the bases with no decreased or absent breath sounds. Gastrointestinal: Bowel sounds 4 quadrants. Soft and nontender to palpation. No guarding or rigidity. No palpable masses. No distention. No CVA tenderness. Musculoskeletal: Full range of motion to all extremities. No gross deformities appreciated. Neurologic:  Normal speech and language. No gross focal neurologic deficits are  appreciated.  Skin:  Skin is warm, dry and intact. No rash noted. Psychiatric: Mood and affect are normal. Speech and behavior are normal. Patient exhibits appropriate insight and judgement.   ED Results / Procedures / Treatments   Labs (all labs ordered are listed, but only abnormal results are displayed) Labs Reviewed  GROUP A STREP BY PCR  RESP PANEL BY RT-PCR (RSV, FLU A&B, COVID)  [id number]       PROCEDURES:  Critical Care performed: No  Procedures   MEDICATIONS ORDERED IN ED: Medications - No data to display   IMPRESSION / MDM / [place] / ED COURSE  I reviewed the triage vital signs and the nursing notes.                              Assessment and plan Pharyngitis 52 year old female presents to the emergency department with pharyngitis, cough and nasal congestion for the past 2 days.  Patient was hypertensive at triage but vital signs were otherwise reassuring.  On exam, patient was alert and nontoxic-appearing.  She tested negative for COVID-19, influenza, RSV and group A strep.  Supportive measures were encouraged.  I did prescribe patient a short course of viscous lidocaine to help with pharyngitis.  Return precautions were given to return with new or worsening symptoms.      FINAL CLINICAL IMPRESSION(S) / ED DIAGNOSES   Final diagnoses:  Pharyngitis, unspecified etiology     Rx / DC Orders   ED Discharge Orders          Ordered    lidocaine (XYLOCAINE) 2 % solution  As needed        12/22/22 2108             Note:  This document was prepared using Dragon voice recognition software and may include unintentional dictation errors.   Vallarie Mare Purple Sage, Hershal Coria 12/22/22 2113    Arta Silence, MD 12/23/22 0020

## 2022-12-22 NOTE — Discharge Instructions (Addendum)
You can alternate Tylenol and ibuprofen for pain. You can use viscous lidocaine for sore throat.

## 2023-01-27 ENCOUNTER — Ambulatory Visit
Admission: RE | Admit: 2023-01-27 | Discharge: 2023-01-27 | Disposition: A | Discharge status: Home / Self Care | Payer: BLUE CROSS/BLUE SHIELD | Source: Ambulatory Visit | Attending: Nurse Practitioner | Admitting: Nurse Practitioner

## 2023-01-27 DIAGNOSIS — Z1231 Encounter for screening mammogram for malignant neoplasm of breast: Secondary | ICD-10-CM | POA: Diagnosis present

## 2023-02-04 ENCOUNTER — Other Ambulatory Visit: Payer: Self-pay | Admitting: Nurse Practitioner

## 2023-02-04 DIAGNOSIS — R921 Mammographic calcification found on diagnostic imaging of breast: Secondary | ICD-10-CM

## 2023-02-04 DIAGNOSIS — R928 Other abnormal and inconclusive findings on diagnostic imaging of breast: Secondary | ICD-10-CM

## 2023-02-05 ENCOUNTER — Ambulatory Visit
Admission: RE | Admit: 2023-02-05 | Discharge: 2023-02-05 | Disposition: A | Discharge status: Home / Self Care | Payer: BLUE CROSS/BLUE SHIELD | Source: Ambulatory Visit | Attending: Nurse Practitioner | Admitting: Nurse Practitioner

## 2023-02-05 DIAGNOSIS — R928 Other abnormal and inconclusive findings on diagnostic imaging of breast: Secondary | ICD-10-CM | POA: Insufficient documentation

## 2023-02-05 DIAGNOSIS — R921 Mammographic calcification found on diagnostic imaging of breast: Secondary | ICD-10-CM | POA: Insufficient documentation

## 2023-02-10 ENCOUNTER — Other Ambulatory Visit: Payer: Self-pay | Admitting: Nurse Practitioner

## 2023-02-10 DIAGNOSIS — R928 Other abnormal and inconclusive findings on diagnostic imaging of breast: Secondary | ICD-10-CM

## 2023-02-10 DIAGNOSIS — R921 Mammographic calcification found on diagnostic imaging of breast: Secondary | ICD-10-CM

## 2023-02-13 ENCOUNTER — Ambulatory Visit
Admission: RE | Admit: 2023-02-13 | Discharge: 2023-02-13 | Disposition: A | Discharge status: Home / Self Care | Payer: Medicare Other | Source: Ambulatory Visit | Attending: Nurse Practitioner | Admitting: Nurse Practitioner

## 2023-02-13 DIAGNOSIS — R928 Other abnormal and inconclusive findings on diagnostic imaging of breast: Secondary | ICD-10-CM | POA: Insufficient documentation

## 2023-02-13 DIAGNOSIS — R921 Mammographic calcification found on diagnostic imaging of breast: Secondary | ICD-10-CM | POA: Diagnosis present

## 2023-02-13 HISTORY — PX: BREAST BIOPSY: SHX20

## 2023-02-13 MED ORDER — LIDOCAINE-EPINEPHRINE 1 %-1:100000 IJ SOLN
10.0000 mL | Freq: Once | INTRAMUSCULAR | Status: AC
  Administered 2023-02-13: 10 mL

## 2023-02-13 MED ORDER — LIDOCAINE HCL (PF) 1 % IJ SOLN
5.0000 mL | Freq: Once | INTRAMUSCULAR | Status: AC
  Administered 2023-02-13: 5 mL

## 2023-02-16 LAB — SURGICAL PATHOLOGY

## 2023-03-20 ENCOUNTER — Emergency Department: Payer: BLUE CROSS/BLUE SHIELD

## 2023-03-20 ENCOUNTER — Emergency Department
Admission: EM | Admit: 2023-03-20 | Discharge: 2023-03-20 | Disposition: A | Discharge status: Home / Self Care | Payer: BLUE CROSS/BLUE SHIELD | Attending: Emergency Medicine | Admitting: Emergency Medicine

## 2023-03-20 ENCOUNTER — Other Ambulatory Visit: Payer: Self-pay

## 2023-03-20 DIAGNOSIS — M546 Pain in thoracic spine: Secondary | ICD-10-CM | POA: Insufficient documentation

## 2023-03-20 DIAGNOSIS — I10 Essential (primary) hypertension: Secondary | ICD-10-CM | POA: Insufficient documentation

## 2023-03-20 DIAGNOSIS — R5383 Other fatigue: Secondary | ICD-10-CM | POA: Insufficient documentation

## 2023-03-20 DIAGNOSIS — R002 Palpitations: Secondary | ICD-10-CM | POA: Diagnosis not present

## 2023-03-20 LAB — CBC WITH DIFFERENTIAL/PLATELET
Abs Immature Granulocytes: 0.04 10*3/uL (ref 0.00–0.07)
Basophils Absolute: 0 10*3/uL (ref 0.0–0.1)
Basophils Relative: 0 %
Eosinophils Absolute: 0 10*3/uL (ref 0.0–0.5)
Eosinophils Relative: 0 %
HCT: 45.3 % (ref 36.0–46.0)
Hemoglobin: 14.9 g/dL (ref 12.0–15.0)
Immature Granulocytes: 0 %
Lymphocytes Relative: 11 %
Lymphs Abs: 1.1 10*3/uL (ref 0.7–4.0)
MCH: 30.4 pg (ref 26.0–34.0)
MCHC: 32.9 g/dL (ref 30.0–36.0)
MCV: 92.4 fL (ref 80.0–100.0)
Monocytes Absolute: 0.3 10*3/uL (ref 0.1–1.0)
Monocytes Relative: 3 %
Neutro Abs: 8.8 10*3/uL — ABNORMAL HIGH (ref 1.7–7.7)
Neutrophils Relative %: 86 %
Platelets: 300 10*3/uL (ref 150–400)
RBC: 4.9 MIL/uL (ref 3.87–5.11)
RDW: 12.5 % (ref 11.5–15.5)
WBC: 10.3 10*3/uL (ref 4.0–10.5)
nRBC: 0 % (ref 0.0–0.2)

## 2023-03-20 LAB — COMPREHENSIVE METABOLIC PANEL
ALT: 18 U/L (ref 0–44)
AST: 23 U/L (ref 15–41)
Albumin: 5.1 g/dL — ABNORMAL HIGH (ref 3.5–5.0)
Alkaline Phosphatase: 35 U/L — ABNORMAL LOW (ref 38–126)
Anion gap: 10 (ref 5–15)
BUN: 17 mg/dL (ref 6–20)
CO2: 25 mmol/L (ref 22–32)
Calcium: 9.8 mg/dL (ref 8.9–10.3)
Chloride: 102 mmol/L (ref 98–111)
Creatinine, Ser: 0.75 mg/dL (ref 0.44–1.00)
GFR, Estimated: >60 mL/min (ref >60)
Glucose, Bld: 106 mg/dL — ABNORMAL HIGH (ref 70–99)
Potassium: 3.3 mmol/L — ABNORMAL LOW (ref 3.5–5.1)
Sodium: 137 mmol/L (ref 135–145)
Total Bilirubin: 0.7 mg/dL (ref 0.3–1.2)
Total Protein: 8.5 g/dL — ABNORMAL HIGH (ref 6.5–8.1)

## 2023-03-20 LAB — URINALYSIS, ROUTINE W REFLEX MICROSCOPIC
Bilirubin Urine: NEGATIVE
Glucose, UA: NEGATIVE mg/dL
Hgb urine dipstick: NEGATIVE
Ketones, ur: NEGATIVE mg/dL
Leukocytes,Ua: NEGATIVE
Nitrite: NEGATIVE
Protein, ur: NEGATIVE mg/dL
Specific Gravity, Urine: 1.011 (ref 1.005–1.030)
pH: 7 (ref 5.0–8.0)

## 2023-03-20 LAB — POC URINE PREG, ED: Preg Test, Ur: NEGATIVE

## 2023-03-20 MED ORDER — IBUPROFEN 800 MG PO TABS: 800.0000 mg | ORAL_TABLET | Freq: Three times a day (TID) | ORAL | 0 refills | Status: DC | PRN

## 2023-03-20 MED ORDER — IBUPROFEN 600 MG PO TABS
600.0000 mg | ORAL_TABLET | Freq: Once | ORAL | Status: AC
  Administered 2023-03-20: 600 mg via ORAL
  Filled 2023-03-20: qty 1

## 2023-03-20 MED ORDER — IOHEXOL 350 MG/ML SOLN
100.0000 mL | Freq: Once | INTRAVENOUS | Status: AC | PRN
  Administered 2023-03-20: 75 mL via INTRAVENOUS

## 2023-03-20 MED ORDER — LIDOCAINE 5 % EX PTCH
1.0000 | MEDICATED_PATCH | Freq: Once | CUTANEOUS | Status: DC
  Administered 2023-03-20: 1 via TRANSDERMAL
  Filled 2023-03-20: qty 1

## 2023-03-20 NOTE — ED Provider Notes (Signed)
[facility] Emergency Department Provider Note     Event Date/Time   First MD Initiated Contact with Patient 03/20/23 1149     (approximate)   History   Back Pain   HPI  Betty Vasquez is a 52 y.o. female with past medical history of hypertension who presents to the emergency department with complaint of back pain x 1 day. patient reports she woke up feeling a sharp pain in her upper back that is worse with deep inspiration.  Pain radiates to her chest.  Associated symptoms includes palpitations and fatigue.  She reports it is worse with lying down.  She denies injury or trauma, and recent strenuous activity.  Denies fever, recent illness, abdominal pain, vomiting, numbness, diarrhea or constipation.  Spanish interpreter used for assistance for communication.   Physical Exam   Triage Vital Signs: ED Triage Vitals  Enc Vitals Group     BP 03/20/23 1030 (!) 155/98     Pulse Rate 03/20/23 1030 96     Resp 03/20/23 1030 16     Temp 03/20/23 1030 98.7 F (37.1 C)     Temp Source 03/20/23 1030 Oral     SpO2 03/20/23 1030 100 %     Weight 03/20/23 1029 144 lb 6.4 oz (65.5 kg)     Height 03/20/23 1029 5\' 4"  (1.626 m)     Head Circumference --      Peak Flow --      Pain Score 03/20/23 1031 8     Pain Loc --      Pain Edu? --      Excl. in [place]? --     Most recent vital signs: Vitals:   03/20/23 1030 03/20/23 1449  BP: (!) 155/98 (!) 150/90  Pulse: 96 80  Resp: 16 16  Temp: 98.7 F (37.1 C)   SpO2: 100% 100%   General: Alert and oriented. INAD.  Skin:  Warm, dry and intact. No rashes or lesions noted.     Head:  NCAT.  Eyes:  PERRLA. EOMI. Conjunctivae clear. Neck:   Supple. No cervical spine tenderness to palpation.  CV:  Good peripheral perfusion.  Normal S1 and S2.  Radial pulses palpated.  RESP:  Normal effort. LCTAB.  ABD:  No distention. Soft. No tenderness to palpation. No CVA tenderness.  BACK:  Spinous process is midline  without deformity.  Tenderness along thoracic aspect with palpation. MSK:   Patient freely moves all extremities. No peripheral edema.  NEURO: Cranial nerves II-XII intact. No focal deficits. Sensation and motor function intact.        ED Results / Procedures / Treatments   Labs (all labs ordered are listed, but only abnormal results are displayed) Labs Reviewed  URINALYSIS, ROUTINE W REFLEX MICROSCOPIC - Abnormal; Notable for the following components:      Result Value   Color, Urine YELLOW (*)    APPearance HAZY (*)    All other components within normal limits  CBC WITH DIFFERENTIAL/PLATELET - Abnormal; Notable for the following components:   Neutro Abs 8.8 (*)    All other components within normal limits  COMPREHENSIVE METABOLIC PANEL - Abnormal; Notable for the following components:   Potassium 3.3 (*)    Glucose, Bld 106 (*)    Total Protein 8.5 (*)    Albumin 5.1 (*)    Alkaline Phosphatase 35 (*)    All other components within normal limits  POC URINE PREG, ED  EKG  Normal Sinus Rhythm   RADIOLOGY  I personally viewed and evaluated these images as part of my medical decision making, as well as reviewing the written report by the radiologist.  ED Provider Interpretation: Chest x-ray unremarkable of cardiopulmonary abnormalities.  CTA of the chest unremarkable.  No results found.  PROCEDURES:  Critical Care performed: No  Procedures   MEDICATIONS ORDERED IN ED: Medications  ibuprofen (ADVIL) tablet 600 mg (600 mg Oral Given 03/20/23 1303)  iohexol (OMNIPAQUE) 350 MG/ML injection 100 mL (75 mLs Intravenous Contrast Given 03/20/23 1606)     IMPRESSION / MDM / ASSESSMENT AND PLAN / ED COURSE  I reviewed the triage vital signs and the nursing notes.                              Differential diagnosis includes, but is not limited to, pericarditis, musculoskeletal back pain, Aortic dissection.    Patient's presentation is most consistent with acute  complicated illness / injury requiring diagnostic workup.  52 year old female presents to the emergency department for evaluation and treatment of acute back pain over thoracic region with radiation anterior to chest.  Pain is worsened with deep inspiration.  See HPI further details.   Given unremarkable imaging and lab results, EKG is normal, patient is in stable condition for outpatient follow-up.  Vital signs are essentially stable with the exception of an elevated blood pressure.  I discussed this with patient and she reports being compliant on her blood pressure medication.  I encouraged her to follow-up with cardiology or primary care to further manage blood pressure.  Patient's diagnosis is consistent with acute MSK pain of the upper back. Patient will be discharged home with prescriptions for ibuprofen. Patient is to follow up with primary care and cardiology as needed or otherwise directed. Patient is given ED precautions to return to the ED for any worsening or new symptoms.  Patient verbalizes understanding and agrees to assessment and plan.  Clinical Course as of 03/22/23 1130  Fri Mar 20, 2023  1254 Pain 8/10. I will give patient ibuprofen and a lidocaine patch at this time.  If no improvement with medication then further evaluation indicated. possible imaging.  [MH]  1509 CBC with Differential [MH]    Clinical Course User Index [MH] Romeo Apple, Angila Wombles A, PA-C    FINAL CLINICAL IMPRESSION(S) / ED DIAGNOSES   Final diagnoses:  Midline thoracic back pain, unspecified chronicity     Rx / DC Orders   ED Discharge Orders          Ordered    ibuprofen (ADVIL) 800 MG tablet  Every 8 hours PRN        03/20/23 1652             Note:  This document was prepared using Dragon voice recognition software and may include unintentional dictation errors.    Kern Reap A, PA-C 03/22/23 1133    Sharman Cheek, MD 03/23/23 [phone]

## 2023-03-20 NOTE — ED Triage Notes (Signed)
Pt here with back pain x1 day. Pt denies NVD. Pt denies injury.

## 2023-03-20 NOTE — ED Notes (Signed)
Pt complains of back and chest pain that began yesterday. Pt states that there isn't anything that she can do to make it feel better. Pt states that the pain feels like something is stuck in her back. The pain then radiates to her chest.

## 2023-07-29 ENCOUNTER — Other Ambulatory Visit: Payer: Self-pay | Admitting: Nurse Practitioner

## 2023-07-29 DIAGNOSIS — R921 Mammographic calcification found on diagnostic imaging of breast: Secondary | ICD-10-CM

## 2023-08-26 ENCOUNTER — Ambulatory Visit
Admission: RE | Admit: 2023-08-26 | Discharge: 2023-08-26 | Disposition: A | Discharge status: Home / Self Care | Payer: BLUE CROSS/BLUE SHIELD | Source: Ambulatory Visit | Attending: Nurse Practitioner | Admitting: Nurse Practitioner

## 2023-08-26 DIAGNOSIS — R921 Mammographic calcification found on diagnostic imaging of breast: Secondary | ICD-10-CM | POA: Diagnosis present

## 2023-10-14 ENCOUNTER — Encounter: Payer: Self-pay | Admitting: Obstetrics and Gynecology

## 2023-10-14 ENCOUNTER — Other Ambulatory Visit (HOSPITAL_COMMUNITY)
Admission: RE | Admit: 2023-10-14 | Discharge: 2023-10-14 | Disposition: A | Discharge status: Home / Self Care | Payer: BLUE CROSS/BLUE SHIELD | Source: Ambulatory Visit | Attending: Obstetrics and Gynecology | Admitting: Obstetrics and Gynecology

## 2023-10-14 ENCOUNTER — Ambulatory Visit (INDEPENDENT_AMBULATORY_CARE_PROVIDER_SITE_OTHER): Payer: BLUE CROSS/BLUE SHIELD | Admitting: Obstetrics and Gynecology

## 2023-10-14 VITALS — BP 144/85 | HR 102 | Ht 64.0 in | Wt 146.4 lb

## 2023-10-14 DIAGNOSIS — Z1151 Encounter for screening for human papillomavirus (HPV): Secondary | ICD-10-CM | POA: Diagnosis not present

## 2023-10-14 DIAGNOSIS — N926 Irregular menstruation, unspecified: Secondary | ICD-10-CM

## 2023-10-14 DIAGNOSIS — Z01419 Encounter for gynecological examination (general) (routine) without abnormal findings: Secondary | ICD-10-CM | POA: Diagnosis present

## 2023-10-14 DIAGNOSIS — N951 Menopausal and female climacteric states: Secondary | ICD-10-CM

## 2023-10-14 DIAGNOSIS — Z1231 Encounter for screening mammogram for malignant neoplasm of breast: Secondary | ICD-10-CM

## 2023-10-14 DIAGNOSIS — Z124 Encounter for screening for malignant neoplasm of cervix: Secondary | ICD-10-CM

## 2023-10-14 NOTE — Progress Notes (Signed)
HPI:      [name] is a 52 y.o. [id number] who LMP was Patient's last menstrual period was 09/27/2023.  Subjective:   She presents today for her annual examination.  She has been having some occasional hot flashes and her period is slightly more irregular.  She still gets monthly regular menses.  She has had a breast biopsy in the last month for "calcium deposits".  It was benign.    Hx: The following portions of the patient's history were reviewed and updated as appropriate:             She  has a past medical history of Anxiety and Hypertension. She does not have any pertinent problems on file. She  has a past surgical history that includes Cesarean section; Tubal ligation; laparoscopic appendectomy (N/A, 11/17/2021); Breast biopsy (Left, 02/13/2023); and Breast biopsy (Left, 02/13/2023). Her family history includes Hypertension in her mother. She  reports that she has never smoked. She has never used smokeless tobacco. She reports that she does not drink alcohol and does not use drugs. She has a current medication list which includes the following prescription(s): amitriptyline, amlodipine, amlodipine, duloxetine, hydrochlorothiazide, ibuprofen, losartan, and effexor xr. She is allergic to codeine.       Review of Systems:  Review of Systems  Constitutional: Denied constitutional symptoms, night sweats, recent illness, fatigue, fever, insomnia and weight loss.  Eyes: Denied eye symptoms, eye pain, photophobia, vision change and visual disturbance.  Ears/Nose/Throat/Neck: Denied ear, nose, throat or neck symptoms, hearing loss, nasal discharge, sinus congestion and sore throat.  Cardiovascular: Denied cardiovascular symptoms, arrhythmia, chest pain/pressure, edema, exercise intolerance, orthopnea and palpitations.  Respiratory: Denied pulmonary symptoms, asthma, pleuritic pain, productive sputum, cough, dyspnea and wheezing.  Gastrointestinal: Denied, gastro-esophageal reflux,  melena, nausea and vomiting.  Genitourinary: Denied genitourinary symptoms including symptomatic vaginal discharge, pelvic relaxation issues, and urinary complaints.  Musculoskeletal: Denied musculoskeletal symptoms, stiffness, swelling, muscle weakness and myalgia.  Dermatologic: Denied dermatology symptoms, rash and scar.  Neurologic: Denied neurology symptoms, dizziness, headache, neck pain and syncope.  Psychiatric: Denied psychiatric symptoms, anxiety and depression.  Endocrine: Denied endocrine symptoms including hot flashes and night sweats.   Meds:   Current Outpatient Medications on File Prior to Visit  Medication Sig Dispense Refill   amitriptyline (ELAVIL) 25 MG tablet      amLODipine (NORVASC) 10 MG tablet Take 10 mg by mouth daily.     amLODipine (NORVASC) 5 MG tablet Take 5 mg by mouth daily. Take 2 tablets by mouth daily     DULoxetine (CYMBALTA) 20 MG capsule Take 20 mg by mouth daily.     hydrochlorothiazide (HYDRODIURIL) 25 MG tablet Take 25 mg by mouth daily.     ibuprofen (ADVIL) 800 MG tablet Take 1 tablet (800 mg total) by mouth every 8 (eight) hours as needed. 30 tablet 0   losartan (COZAAR) 50 MG tablet Take 50 mg by mouth daily.     EFFEXOR XR 37.5 MG 24 hr capsule Take 1 capsule by mouth daily.     No current facility-administered medications on file prior to visit.     Objective:     Vitals:   10/14/23 0933  BP: (!) 142/98  Pulse: (!) 102    Filed Weights   10/14/23 0933  Weight: 146 lb 6.4 oz (66.4 kg)              Physical examination General NAD, Conversant  HEENT Atraumatic; Op clear with mmm.  Normo-cephalic.  Anicteric sclerae  Thyroid/Neck Smooth without nodularity or enlargement. Normal ROM.  Neck Supple.  Skin No rashes, lesions or ulceration. Normal palpated skin turgor. No nodularity.  Breasts: No masses or discharge.  Symmetric.  No axillary adenopathy.  Lungs: Clear to auscultation.No rales or wheezes. Normal Respiratory effort, no  retractions.  Heart: NSR.  No murmurs or rubs appreciated. No peripheral edema  Abdomen: Soft.  Non-tender.  No masses.  No HSM. No hernia  Extremities: Moves all appropriately.  Normal ROM for age. No lymphadenopathy.  Neuro: Oriented to PPT.  Normal mood. Normal affect.     Pelvic:   Vulva: Normal appearance.  No lesions.  Vagina: No lesions or abnormalities noted.  Support: Normal pelvic support.  Urethra No masses tenderness or scarring.  Meatus Normal size without lesions or prolapse.  Cervix: Normal appearance.  No lesions.  Anus: Normal exam.  No lesions.  Perineum: Normal exam.  No lesions.        Bimanual   Uterus: Normal size.  Non-tender.  Mobile.  AV.  Adnexae: No masses.  Non-tender to palpation.  Cul-de-sac: Negative for abnormality.     Assessment:    G2P2002 Patient Active Problem List   Diagnosis Date Noted   Acute appendicitis 11/17/2021   Umbilical hernia without obstruction and without gangrene    Elevated blood pressure reading 08/04/2018   History of tubal ligation 04/27/2017   History of 2 cesarean sections 04/27/2017     1. Well woman exam with routine gynecological exam   2. Cervical cancer screening   3. Screening mammogram for breast cancer   4. Irregular menstrual cycle   5. Menopausal symptoms     Patient probably perimenopausal based on age and menopausal symptoms.  These are not disabling to her.  She does not want them fixed at this time.   Plan:            1.  Basic Screening Recommendations The basic screening recommendations for asymptomatic women were discussed with the patient during her visit.  The age-appropriate recommendations were discussed with her and the rational for the tests reviewed.  When I am informed by the patient that another primary care physician has previously obtained the age-appropriate tests and they are up-to-date, only outstanding tests are ordered and referrals given as necessary.  Abnormal results of tests  will be discussed with her when all of her results are completed.  Routine preventative health maintenance measures emphasized: Exercise/Diet/Weight control, Tobacco Warnings, Alcohol/Substance use risks and Stress Management Pap performed-mammogram ordered-patient to return for fasting blood work 2.  If she begins to miss multiple periods in a row I have asked her to schedule an appointment to discuss menopause and possible HRT. Orders Orders Placed This Encounter  Procedures   Basic metabolic panel   CBC   TSH   Lipid panel   Hemoglobin A1c    No orders of the defined types were placed in this encounter.        F/U  No follow-ups on file.  Elonda Husky, M.D. 10/14/2023 9:45 AM

## 2023-10-14 NOTE — Progress Notes (Signed)
Patients presents for annual exam today. She states her cycles are becoming irregular and she has been experiencing hot flashes. Due for pap smear and mammogram, ordered. Annual labs are ordered. She states no other questions or concerns at this time.

## 2023-10-15 LAB — CYTOLOGY - PAP
Comment: NEGATIVE
Diagnosis: NEGATIVE
High risk HPV: NEGATIVE

## 2023-10-16 ENCOUNTER — Other Ambulatory Visit: Payer: Medicare Other

## 2023-10-16 DIAGNOSIS — Z01419 Encounter for gynecological examination (general) (routine) without abnormal findings: Secondary | ICD-10-CM

## 2023-10-17 LAB — BASIC METABOLIC PANEL
BUN/Creatinine Ratio: 23 (ref 9–23)
BUN: 20 mg/dL (ref 6–24)
CO2: 22 mmol/L (ref 20–29)
Calcium: 9.2 mg/dL (ref 8.7–10.2)
Chloride: 103 mmol/L (ref 96–106)
Creatinine, Ser: 0.86 mg/dL (ref 0.57–1.00)
Glucose: 92 mg/dL (ref 70–99)
Potassium: 3.9 mmol/L (ref 3.5–5.2)
Sodium: 139 mmol/L (ref 134–144)
eGFR: 82 mL/min/{1.73_m2} (ref >59)

## 2023-10-17 LAB — CBC
Hematocrit: 39.9 % (ref 34.0–46.6)
Hemoglobin: 12.6 g/dL (ref 11.1–15.9)
MCH: 30.6 pg (ref 26.6–33.0)
MCHC: 31.6 g/dL (ref 31.5–35.7)
MCV: 97 fL (ref 79–97)
Platelets: 259 10*3/uL (ref 150–450)
RBC: 4.12 x10E6/uL (ref 3.77–5.28)
RDW: 12.2 % (ref 11.7–15.4)
WBC: 6.3 10*3/uL (ref 3.4–10.8)

## 2023-10-17 LAB — LIPID PANEL
Chol/HDL Ratio: 4.7 {ratio} — ABNORMAL HIGH (ref 0.0–4.4)
Cholesterol, Total: 245 mg/dL — ABNORMAL HIGH (ref 100–199)
HDL: 52 mg/dL (ref >39)
LDL Chol Calc (NIH): 171 mg/dL — ABNORMAL HIGH (ref 0–99)
Triglycerides: 121 mg/dL (ref 0–149)
VLDL Cholesterol Cal: 22 mg/dL (ref 5–40)

## 2023-10-17 LAB — HEMOGLOBIN A1C
Est. average glucose Bld gHb Est-mCnc: 114 mg/dL
Hgb A1c MFr Bld: 5.6 % (ref 4.8–5.6)

## 2023-10-17 LAB — TSH: TSH: 1.71 u[IU]/mL (ref 0.450–4.500)

## 2023-12-25 ENCOUNTER — Other Ambulatory Visit: Payer: Self-pay | Admitting: Obstetrics and Gynecology

## 2023-12-25 DIAGNOSIS — Z1231 Encounter for screening mammogram for malignant neoplasm of breast: Secondary | ICD-10-CM

## 2024-01-29 ENCOUNTER — Ambulatory Visit
Admission: RE | Admit: 2024-01-29 | Discharge: 2024-01-29 | Disposition: A | Discharge status: Home / Self Care | Payer: Medicare Other | Source: Ambulatory Visit | Attending: Obstetrics and Gynecology | Admitting: Obstetrics and Gynecology

## 2024-01-29 DIAGNOSIS — Z1231 Encounter for screening mammogram for malignant neoplasm of breast: Secondary | ICD-10-CM | POA: Diagnosis present

## 2024-03-11 ENCOUNTER — Emergency Department
Admission: EM | Admit: 2024-03-11 | Discharge: 2024-03-11 | Disposition: A | Discharge status: Home / Self Care | Attending: Emergency Medicine | Admitting: Emergency Medicine

## 2024-03-11 ENCOUNTER — Other Ambulatory Visit: Payer: Self-pay

## 2024-03-11 DIAGNOSIS — R300 Dysuria: Secondary | ICD-10-CM | POA: Diagnosis present

## 2024-03-11 DIAGNOSIS — N3090 Cystitis, unspecified without hematuria: Secondary | ICD-10-CM | POA: Insufficient documentation

## 2024-03-11 LAB — URINALYSIS, ROUTINE W REFLEX MICROSCOPIC
Bilirubin Urine: NEGATIVE
Glucose, UA: NEGATIVE mg/dL
Ketones, ur: NEGATIVE mg/dL
Nitrite: NEGATIVE
Protein, ur: 100 mg/dL — AB
RBC / HPF: >50 RBC/hpf (ref 0–5)
Specific Gravity, Urine: 1.018 (ref 1.005–1.030)
WBC, UA: >50 WBC/hpf (ref 0–5)
pH: 6 (ref 5.0–8.0)

## 2024-03-11 MED ORDER — PHENAZOPYRIDINE HCL 200 MG PO TABS: 200.0000 mg | ORAL_TABLET | Freq: Three times a day (TID) | ORAL | 0 refills | Status: AC | PRN

## 2024-03-11 MED ORDER — PHENAZOPYRIDINE HCL 100 MG PO TABS
95.0000 mg | ORAL_TABLET | Freq: Once | ORAL | Status: AC
  Administered 2024-03-11: 100 mg via ORAL
  Filled 2024-03-11: qty 1

## 2024-03-11 MED ORDER — CEPHALEXIN 500 MG PO CAPS
500.0000 mg | ORAL_CAPSULE | Freq: Once | ORAL | Status: AC
  Administered 2024-03-11: 500 mg via ORAL
  Filled 2024-03-11: qty 1

## 2024-03-11 MED ORDER — CEPHALEXIN 500 MG PO CAPS: 500.0000 mg | ORAL_CAPSULE | Freq: Three times a day (TID) | ORAL | 0 refills | Status: DC

## 2024-03-11 NOTE — ED Provider Notes (Signed)
 [facility] Provider Note    Event Date/Time   First MD Initiated Contact with Patient 03/11/24 1937     (approximate)   History   Dysuria    HPI  Betty Vasquez is a 53 y.o. female    with a past medical history of left breast calcification, middle thoracic back pain, discectomy myalgia and pineal gland cyst, with no significant past medical history who presents to the ED complaining of dysuria, frequency, suprapubic pain.  Last menstrual period last month.  Patient denies fever, chills, cough, diarrhea.  Patient is allergic to oxycodone       Physical Exam   Triage Vital Signs: ED Triage Vitals [03/11/24 1745]  Encounter Vitals Group     BP (!) 135/96     Systolic BP Percentile      Diastolic BP Percentile      Pulse Rate (!) 103     Resp 20     Temp 98.4 F (36.9 C)     Temp Source Oral     SpO2 100 %     Weight      Height      Head Circumference      Peak Flow      Pain Score 2     Pain Loc      Pain Education      Exclude from Growth Chart     Most recent vital signs: Vitals:   03/11/24 1745  BP: (!) 135/96  Pulse: (!) 103  Resp: 20  Temp: 98.4 F (36.9 C)  SpO2: 100%     Constitutional: Alert, NAD. Able to speak in complete sentences without cough or dyspnea  Eyes: Conjunctivae are normal.  Head: Atraumatic. Nose: No congestion/rhinnorhea. Mouth/Throat: Mucous membranes are moist.   Neck: Painless ROM. Supple. No JVD, nodes, thyromegaly  Cardiovascular:   Good peripheral circulation.RRR no murmurs, gallops, rubs  Respiratory: Normal respiratory effort.  No retractions. Clear to auscultation bilaterally without wheezing or crackles  Gastrointestinal: Soft and nontender.  Tender to palpation in suprapubic area, CVA bilateral negative Musculoskeletal:  no deformity Neurologic:  MAE spontaneously. No gross focal neurologic deficits are appreciated.  Skin:  Skin is warm, dry and intact. No rash noted. Psychiatric:  Mood and affect are normal. Speech and behavior are normal.    ED Results / Procedures / Treatments   Labs (all labs ordered are listed, but only abnormal results are displayed) Labs Reviewed  URINALYSIS, ROUTINE W REFLEX MICROSCOPIC - Abnormal; Notable for the following components:      Result Value   Color, Urine YELLOW (*)    APPearance CLOUDY (*)    Hgb urine dipstick LARGE (*)    Protein, ur 100 (*)    Leukocytes,Ua LARGE (*)    Bacteria, UA RARE (*)    All other components within normal limits     EKG  RADIOLOGY    PROCEDURES:  Critical Care performed:   Procedures   MEDICATIONS ORDERED IN ED: Medications  phenazopyridine (PYRIDIUM) tablet 100 mg (has no administration in time range)  cephALEXin  (KEFLEX ) capsule 500 mg (has no administration in time range)   Clinical Course as of 03/11/24 2005  Fri Mar 11, 2024  1939 Urinalysis, Routine w reflex microscopic -Urine, Clean Catch(!) Cloudy, hemoglobin large, protein positive, leukocytes large, bacteria rare [AE]    Clinical Course User Index [AE] Awilda Lennox, PA-C    IMPRESSION / MDM / ASSESSMENT AND PLAN / ED COURSE  I reviewed  the triage vital signs and the nursing notes.  Differential diagnosis includes, but is not limited to, UTI, pyelonephritis, appendicitis, ectopic pregnancy Patient's presentation is most consistent with acute complicated illness / injury requiring diagnostic workup.  Patient's diagnosis is consistent with cystitis with hematuria. . Labs are  reassuring.  Exam is reassuring I did review the patient's allergies and medications.During admission patient received ceftriaxone, Pyridium to the pharmacy hours.  The patient is in stable and satisfactory condition for discharge home  Patient will be discharged home with prescriptions for Keflex , Pyridium. Patient is to follow up with PCP as needed or otherwise directed. Patient is given ED precautions to return to the ED for any worsening or  new symptoms. Discussed plan of care with patient, answered all of patient's questions, Patient agreeable to plan of care. Advised patient to take medications according to the instructions on the label. Discussed possible side effects of new medications. Patient verbalized understanding.    FINAL CLINICAL IMPRESSION(S) / ED DIAGNOSES   Final diagnoses:  Cystitis     Rx / DC Orders   ED Discharge Orders          Ordered    cephALEXin  (KEFLEX ) 500 MG capsule  3 times daily        03/11/24 2005    phenazopyridine (PYRIDIUM) 200 MG tablet  3 times daily PRN        03/11/24 2005             Note:  This document was prepared using Dragon voice recognition software and may include unintentional dictation errors.   Awilda Lennox, PA-C 03/11/24 Haig Levan, MD 03/11/24 2018

## 2024-03-11 NOTE — ED Triage Notes (Signed)
 Pt to ED via POV from home. Pt ambulatory to triage. Pt reports pain with urination and feel increased urgency. Pt reports some pelvic pressure. No fevers.

## 2024-03-11 NOTE — Discharge Instructions (Addendum)
 Have been diagnosed with hemorrhagic cystitis.  Please take cephalexin  1 tablet by mouth every 8 hours after main meals for 7 days.  Please take Pyridium 1 tablet by mouth every 8 hours as needed for pain.  Drink plenty of fluids.  If you have new symptoms or symptoms worsen please come back to ED or go to your PCP

## 2024-03-13 DIAGNOSIS — N390 Urinary tract infection, site not specified: Secondary | ICD-10-CM

## 2024-03-13 HISTORY — DX: Urinary tract infection, site not specified: N39.0

## 2024-03-27 ENCOUNTER — Emergency Department
Admission: EM | Admit: 2024-03-27 | Discharge: 2024-03-27 | Disposition: A | Discharge status: Home / Self Care | Attending: Emergency Medicine | Admitting: Emergency Medicine

## 2024-03-27 ENCOUNTER — Other Ambulatory Visit: Payer: Self-pay

## 2024-03-27 ENCOUNTER — Emergency Department

## 2024-03-27 DIAGNOSIS — R1030 Lower abdominal pain, unspecified: Secondary | ICD-10-CM | POA: Diagnosis not present

## 2024-03-27 DIAGNOSIS — N9489 Other specified conditions associated with female genital organs and menstrual cycle: Secondary | ICD-10-CM | POA: Insufficient documentation

## 2024-03-27 DIAGNOSIS — R350 Frequency of micturition: Secondary | ICD-10-CM | POA: Insufficient documentation

## 2024-03-27 DIAGNOSIS — R1033 Periumbilical pain: Secondary | ICD-10-CM | POA: Diagnosis present

## 2024-03-27 LAB — BASIC METABOLIC PANEL WITH GFR
Anion gap: 10 (ref 5–15)
BUN: 18 mg/dL (ref 6–20)
CO2: 26 mmol/L (ref 22–32)
Calcium: 9.7 mg/dL (ref 8.9–10.3)
Chloride: 102 mmol/L (ref 98–111)
Creatinine, Ser: 0.75 mg/dL (ref 0.44–1.00)
GFR, Estimated: >60 mL/min (ref >60)
Glucose, Bld: 118 mg/dL — ABNORMAL HIGH (ref 70–99)
Potassium: 3.6 mmol/L (ref 3.5–5.1)
Sodium: 138 mmol/L (ref 135–145)

## 2024-03-27 LAB — URINALYSIS, ROUTINE W REFLEX MICROSCOPIC
Bilirubin Urine: NEGATIVE
Glucose, UA: NEGATIVE mg/dL
Ketones, ur: NEGATIVE mg/dL
Leukocytes,Ua: NEGATIVE
Nitrite: NEGATIVE
Protein, ur: NEGATIVE mg/dL
Specific Gravity, Urine: 1.002 — ABNORMAL LOW (ref 1.005–1.030)
pH: 7 (ref 5.0–8.0)

## 2024-03-27 LAB — HEPATIC FUNCTION PANEL
ALT: 20 U/L (ref 0–44)
AST: 22 U/L (ref 15–41)
Albumin: 4.4 g/dL (ref 3.5–5.0)
Alkaline Phosphatase: 30 U/L — ABNORMAL LOW (ref 38–126)
Bilirubin, Direct: <0.1 mg/dL (ref 0.0–0.2)
Total Bilirubin: 0.6 mg/dL (ref 0.0–1.2)
Total Protein: 7.4 g/dL (ref 6.5–8.1)

## 2024-03-27 LAB — CBC
HCT: 39.2 % (ref 36.0–46.0)
Hemoglobin: 13 g/dL (ref 12.0–15.0)
MCH: 30.2 pg (ref 26.0–34.0)
MCHC: 33.2 g/dL (ref 30.0–36.0)
MCV: 91.2 fL (ref 80.0–100.0)
Platelets: 285 10*3/uL (ref 150–400)
RBC: 4.3 MIL/uL (ref 3.87–5.11)
RDW: 12.3 % (ref 11.5–15.5)
WBC: 7.2 10*3/uL (ref 4.0–10.5)
nRBC: 0 % (ref 0.0–0.2)

## 2024-03-27 LAB — TROPONIN I (HIGH SENSITIVITY): Troponin I (High Sensitivity): <2 ng/L (ref <18)

## 2024-03-27 LAB — POC URINE PREG, ED: Preg Test, Ur: NEGATIVE

## 2024-03-27 LAB — LIPASE, BLOOD: Lipase: 40 U/L (ref 11–51)

## 2024-03-27 LAB — CK: Total CK: 136 U/L (ref 38–234)

## 2024-03-27 MED ORDER — SODIUM CHLORIDE 0.9 % IV BOLUS
1000.0000 mL | Freq: Once | INTRAVENOUS | Status: AC
  Administered 2024-03-27: 1000 mL via INTRAVENOUS

## 2024-03-27 MED ORDER — IOHEXOL 300 MG/ML  SOLN
100.0000 mL | Freq: Once | INTRAMUSCULAR | Status: AC | PRN
  Administered 2024-03-27: 100 mL via INTRAVENOUS

## 2024-03-27 MED ORDER — LIDOCAINE 5 % EX PTCH: 1.0000 | MEDICATED_PATCH | CUTANEOUS | 0 refills | Status: AC

## 2024-03-27 MED ORDER — LIDOCAINE 5 % EX PTCH
1.0000 | MEDICATED_PATCH | Freq: Once | CUTANEOUS | Status: DC
  Administered 2024-03-27: 1 via TRANSDERMAL
  Filled 2024-03-27: qty 1

## 2024-03-27 MED ORDER — KETOROLAC TROMETHAMINE 15 MG/ML IJ SOLN
15.0000 mg | Freq: Once | INTRAMUSCULAR | Status: AC
  Administered 2024-03-27: 15 mg via INTRAVENOUS
  Filled 2024-03-27: qty 1

## 2024-03-27 MED ORDER — IBUPROFEN 600 MG PO TABS: 600.0000 mg | ORAL_TABLET | Freq: Four times a day (QID) | ORAL | 0 refills | Status: AC | PRN

## 2024-03-27 MED ORDER — FENTANYL CITRATE PF 50 MCG/ML IJ SOSY
50.0000 ug | PREFILLED_SYRINGE | Freq: Once | INTRAMUSCULAR | Status: AC
  Administered 2024-03-27: 50 ug via INTRAVENOUS
  Filled 2024-03-27: qty 1

## 2024-03-27 MED ORDER — ONDANSETRON HCL 4 MG/2ML IJ SOLN
4.0000 mg | Freq: Once | INTRAMUSCULAR | Status: AC
Start: 2024-03-27 — End: 2024-03-27
  Administered 2024-03-27: 4 mg via INTRAVENOUS
  Filled 2024-03-27: qty 2

## 2024-03-27 NOTE — ED Triage Notes (Signed)
 Pt to ED for pain to bladder area since yesterday, treated for UTI 2 weeks ago. Also having bilateral lower back pain since yesterday. States urinating frequently but no burning with urination.

## 2024-03-27 NOTE — ED Provider Notes (Addendum)
 [facility] Provider Note    Event Date/Time   First MD Initiated Contact with Patient 03/27/24 1309     (approximate)   History   UTI   HPI  Betty Vasquez is a 53 y.o. female who comes in with concerns for umbilical pain.  Patient reports that she was recently treated for UTI.  She reports that her antibiotics were completed yesterday and then she started to develop some periumbilical abdominal pain.  She reports the pain is a 7 out of 10.  She denies any blood in her stools, diarrhea, vomiting.  She reports some increased urination but no longer has the burning.  She reports feeling anxious but she denies any overt chest pain, shortness of breath.  Spanish interpretor was used   Physical Exam   Triage Vital Signs: ED Triage Vitals  Encounter Vitals Group     BP 03/27/24 1243 (!) 163/98     Systolic BP Percentile --      Diastolic BP Percentile --      Pulse Rate 03/27/24 1243 (!) 104     Resp 03/27/24 1243 18     Temp 03/27/24 1243 98.2 F (36.8 C)     Temp Source 03/27/24 1243 Oral     SpO2 03/27/24 1243 100 %     Weight 03/27/24 1244 144 lb (65.3 kg)     Height 03/27/24 1244 5\' 4"  (1.626 m)     Head Circumference --      Peak Flow --      Pain Score 03/27/24 1242 8     Pain Loc --      Pain Education --      Exclude from Growth Chart --     Most recent vital signs: Vitals:   03/27/24 1243  BP: (!) 163/98  Pulse: (!) 104  Resp: 18  Temp: 98.2 F (36.8 C)  SpO2: 100%     General: Awake, no distress.  CV:  Good peripheral perfusion.  Resp:  Normal effort.  Abd:  No distention.  Slight tenderness in the lower abdomen without any rebound or guarding. Other:  No swelling legs.  No calf tenderness   ED Results / Procedures / Treatments   Labs (all labs ordered are listed, but only abnormal results are displayed) Labs Reviewed  URINALYSIS, ROUTINE W REFLEX MICROSCOPIC - Abnormal; Notable for the following components:       Result Value   Color, Urine STRAW (*)    APPearance CLEAR (*)    Specific Gravity, Urine 1.002 (*)    Hgb urine dipstick LARGE (*)    Bacteria, UA RARE (*)    All other components within normal limits  URINE CULTURE  CBC  BASIC METABOLIC PANEL WITH GFR  HEPATIC FUNCTION PANEL  LIPASE, BLOOD  POC URINE PREG, ED  TROPONIN I (HIGH SENSITIVITY)     EKG  My interpretation of EKG:  Normal sinus rate of 89 without any ST elevation or T wave inversions, normal intervals  RADIOLOGY I have reviewed the CT personally and interpreted no obvious kidney stone   PROCEDURES:  Critical Care performed: No  Procedures   MEDICATIONS ORDERED IN ED: Medications  fentaNYL  (SUBLIMAZE ) injection 50 mcg (has no administration in time range)  ondansetron  (ZOFRAN ) injection 4 mg (has no administration in time range)  sodium chloride  0.9 % bolus 1,000 mL (has no administration in time range)     IMPRESSION / MDM / ASSESSMENT AND PLAN / ED  COURSE  I reviewed the triage vital signs and the nursing notes.   Patient's presentation is most consistent with acute presentation with potential threat to life or bodily function.   Patient comes in with periumbilical abdominal pain after recent treatment for UTI.  Blood work was done with urine that looks negative for UTI therefore I will get CT imaging to evaluate for acute pathology such as diverticulitis, abscess, mass or other acute pathology.  Patient given some IV fentanyl  and Zofran  and fluids to help with symptoms.  Pregnancy test was negative.  Urine with some blood in it.  BMP reassuring CBC reassuring hepatic function normal lipase normal, troponin negative  1. No acute intra-abdominal or pelvic finding by CT. 2. Stable left hepatic cyst. 3. Stable left lower lobe 5 mm subpleural nodule. No follow-up needed if patient is low-risk. Non-contrast chest CT can be considered in 12 months if patient is high-risk. This recommendation follows the  consensus statement: Guidelines for Management of Incidental Pulmonary Nodules Detected on CT Images: From the Fleischner Society 2017; Radiology 2017; 284:228-243. 4. Similar enlarged dilated left ovarian vein and prominent left hemipelvis venous plexus suggesting ovarian venous insufficiency (can be seen with pelvic congestion syndrome). 5. Aortic atherosclerosis.  On repeat evaluation patient reports pain is not much improved.  She does report some chronic low back pain denies any symptoms to suggest cord compression.  We discussed her incidental findings.  We discussed ultrasound to evaluate for her ovaries but her pain is now mostly just over her bladder area.  CT did not show any obvious ovarian issue and she does not want to do a vaginal ultrasound.  She also denies any vaginal discharge or concerns for STDs.  She is currently on her menstruation.  I suspect this is what is causing the blood in her urine.  I will give her some Toradol , lidocaine  patches but at this time patient prefers to follow-up outpatient.  We discussed the pelvic congestion syndrome and symptoms that can cause some chronic lower pelvic pain but patient reports that she just feels like she has to urinate more often.  We recommend she follow-up with OB/GYN that her urine is sent for culture and if it grows anything we will call her back and she expressed understanding and felt comfortable with that plan.  She does report recent normal colonoscopy.   She understands need for repeat CT in one year for lung nodule  Spanish interpretor was used.   The patient is on the cardiac monitor to evaluate for evidence of arrhythmia and/or significant heart rate changes.      FINAL CLINICAL IMPRESSION(S) / ED DIAGNOSES   Final diagnoses:  Pelvic congestion syndrome  Lower abdominal pain  Urinary frequency     Rx / DC Orders   ED Discharge Orders          Ordered    lidocaine  (LIDODERM ) 5 %  Every 24 hours         03/27/24 1529    ibuprofen  (ADVIL ) 600 MG tablet  Every 6 hours PRN        03/27/24 1529             Note:  This document was prepared using Dragon voice recognition software and may include unintentional dictation errors.   Lubertha Rush, MD 03/27/24 1529    Lubertha Rush, MD 03/27/24 [phone]

## 2024-03-27 NOTE — Discharge Instructions (Addendum)
 Your workup was reassuring you can follow-up with OB/GYN.  Your urine culture is in process.  We will call you if it is positive.  1. No acute intra-abdominal or pelvic finding by CT. 2. Stable left hepatic cyst. 3. Stable left lower lobe 5 mm subpleural nodule. No follow-up needed if patient is low-risk. Non-contrast chest CT can be considered in 12 months if patient is high-risk. This recommendation follows the consensus statement: Guidelines for Management of Incidental Pulmonary Nodules Detected on CT Images: From the Fleischner Society 2017; Radiology 2017; 284:228-243. 4. Similar enlarged dilated left ovarian vein and prominent left hemipelvis venous plexus suggesting ovarian venous insufficiency (can be seen with pelvic congestion syndrome). 5. Aortic atherosclerosis.

## 2024-03-28 LAB — URINE CULTURE: Culture: NO GROWTH

## 2024-04-05 ENCOUNTER — Ambulatory Visit (INDEPENDENT_AMBULATORY_CARE_PROVIDER_SITE_OTHER): Admitting: Obstetrics and Gynecology

## 2024-04-05 ENCOUNTER — Encounter: Payer: Self-pay | Admitting: Obstetrics and Gynecology

## 2024-04-05 VITALS — BP 151/87 | HR 84 | Ht 64.0 in | Wt 149.2 lb

## 2024-04-05 DIAGNOSIS — R102 Pelvic and perineal pain: Secondary | ICD-10-CM

## 2024-04-05 DIAGNOSIS — R350 Frequency of micturition: Secondary | ICD-10-CM

## 2024-04-05 LAB — POCT URINALYSIS DIPSTICK
Bilirubin, UA: NEGATIVE
Blood, UA: NEGATIVE
Glucose, UA: NEGATIVE
Leukocytes, UA: NEGATIVE
Nitrite, UA: NEGATIVE
Protein, UA: NEGATIVE
Spec Grav, UA: 1.01 (ref 1.010–1.025)
Urobilinogen, UA: 0.2 U/dL
pH, UA: 6 (ref 5.0–8.0)

## 2024-04-05 MED ORDER — MEGESTROL ACETATE 40 MG PO TABS: 40.0000 mg | ORAL_TABLET | Freq: Every day | ORAL | 3 refills | Status: DC

## 2024-04-05 NOTE — Progress Notes (Signed)
 HPI:      [name] is a 53 y.o. [id number] who LMP was Patient's last menstrual period was 03/18/2024.  Subjective:   She presents today after being seen in the emergency department on 2 different occasions for midline and bilateral lower quadrant pelvic pain.  She states that her pain has now improved since then but it continues when she presses on her stomach.  She reports it as a strange pain mostly in the midline running from her umbilicus to her pubic bone.  Urine cultures in the emergency department were negative for infection.  She had a CT performed in the emergency department which showed a normal pelvis with the exception of the possibility of pelvic congestion syndrome. She reports normal regular bowel habits. She reports that she continues to have monthly regular menstrual periods.  She says that the first 2 days are normal and then she has 10 to 12 days of spotting.    Hx: The following portions of the patient's history were reviewed and updated as appropriate:             She  has a past medical history of Anxiety, Hypertension, and UTI (urinary tract infection) (03/13/2024). She does not have any pertinent problems on file. She  has a past surgical history that includes Cesarean section; Tubal ligation; laparoscopic appendectomy (N/A, 11/17/2021); Breast biopsy (Left, 02/13/2023); Breast biopsy (Left, 02/13/2023); and Appendectomy. Her family history includes Hypertension in her mother. She  reports that she has never smoked. She has never used smokeless tobacco. She reports that she does not drink alcohol and does not use drugs. She has a current medication list which includes the following prescription(s): amitriptyline, amlodipine , amlodipine , duloxetine , hydrochlorothiazide, lidocaine , losartan, megestrol, and phenazopyridine . She is allergic to codeine.       Review of Systems:  Review of Systems  Constitutional: Denied constitutional symptoms, night sweats,  recent illness, fatigue, fever, insomnia and weight loss.  Eyes: Denied eye symptoms, eye pain, photophobia, vision change and visual disturbance.  Ears/Nose/Throat/Neck: Denied ear, nose, throat or neck symptoms, hearing loss, nasal discharge, sinus congestion and sore throat.  Cardiovascular: Denied cardiovascular symptoms, arrhythmia, chest pain/pressure, edema, exercise intolerance, orthopnea and palpitations.  Respiratory: Denied pulmonary symptoms, asthma, pleuritic pain, productive sputum, cough, dyspnea and wheezing.  Gastrointestinal: Denied, gastro-esophageal reflux, melena, nausea and vomiting.  Genitourinary: See HPI for additional information.  Musculoskeletal: Denied musculoskeletal symptoms, stiffness, swelling, muscle weakness and myalgia.  Dermatologic: Denied dermatology symptoms, rash and scar.  Neurologic: Denied neurology symptoms, dizziness, headache, neck pain and syncope.  Psychiatric: Denied psychiatric symptoms, anxiety and depression.  Endocrine: Denied endocrine symptoms including hot flashes and night sweats.   Meds:   Current Outpatient Medications on File Prior to Visit  Medication Sig Dispense Refill   amitriptyline (ELAVIL) 25 MG tablet      amLODipine  (NORVASC ) 10 MG tablet Take 10 mg by mouth daily.     amLODipine  (NORVASC ) 5 MG tablet Take 5 mg by mouth daily. Take 2 tablets by mouth daily     DULoxetine  (CYMBALTA ) 20 MG capsule Take 20 mg by mouth daily.     hydrochlorothiazide (HYDRODIURIL) 25 MG tablet Take 25 mg by mouth daily.     lidocaine  (LIDODERM ) 5 % Place 1 patch onto the skin daily for 10 days. Remove & Discard patch within 12 hours or as directed by MD and leave off 12 hours before placing new patch 10 patch 0   losartan (COZAAR) 50 MG tablet Take  50 mg by mouth daily.     phenazopyridine  (PYRIDIUM ) 200 MG tablet Take 1 tablet (200 mg total) by mouth 3 (three) times daily as needed for pain. 20 tablet 0   No current facility-administered  medications on file prior to visit.      Objective:      Vitals:   04/05/24 0948  BP: (!) 151/87  Pulse: 84   Filed Weights   04/05/24 0948  Weight: 149 lb 3.2 oz (67.7 kg)              CT results reviewed          Assessment:     [id number] Patient Active Problem List   Diagnosis Date Noted   Acute appendicitis 11/17/2021   Umbilical hernia without obstruction and without gangrene    Elevated blood pressure reading 08/04/2018   History of tubal ligation 04/27/2017   History of 2 cesarean sections 04/27/2017     1. Pelvic pain   2. Urinary frequency     Possible pelvic congestion syndrome.  She also has issues with prolonged menstrual periods.   Plan:            1.  We have discussed pelvic congestion syndrome and the pain and causes in some detail.  I have suggested use of progesterone 25 days on 5 days off per month to help with pelvic congestion syndrome and possibly give her better cycle regulation.  Should this fail and she continues to have pain would suggest a urology referral.  Orders Orders Placed This Encounter  Procedures   Urine Culture   POCT Urinalysis Dipstick     Meds ordered this encounter  Medications   megestrol (MEGACE) 40 MG tablet    Sig: Take 1 tablet (40 mg total) by mouth daily. 25 days on 5 days of as directed.    Dispense:  60 tablet    Refill:  3      F/U  Return in about 4 months (around 08/05/2024).  Delice Felt, M.D. 04/05/2024 10:37 AM

## 2024-04-05 NOTE — Progress Notes (Signed)
 Patient presents today for an ED follow-up. She states having pelvic pain and dysuria which led her to the ED twice. Today reports pain when she presses her stomach and urinary frequency.

## 2024-10-31 ENCOUNTER — Ambulatory Visit: Admitting: Registered Nurse

## 2024-11-25 ENCOUNTER — Ambulatory Visit: Payer: Self-pay | Admitting: Registered Nurse

## 2024-11-25 ENCOUNTER — Encounter: Payer: Self-pay | Admitting: Registered Nurse

## 2024-11-25 ENCOUNTER — Ambulatory Visit: Admitting: Registered Nurse

## 2024-11-25 ENCOUNTER — Other Ambulatory Visit (HOSPITAL_COMMUNITY)
Admission: RE | Admit: 2024-11-25 | Discharge: 2024-11-25 | Disposition: A | Source: Ambulatory Visit | Attending: Registered Nurse | Admitting: Registered Nurse

## 2024-11-25 VITALS — BP 142/86 | HR 89 | Ht 64.0 in | Wt 151.9 lb

## 2024-11-25 DIAGNOSIS — R102 Pelvic and perineal pain unspecified side: Secondary | ICD-10-CM | POA: Diagnosis present

## 2024-11-25 DIAGNOSIS — Z01411 Encounter for gynecological examination (general) (routine) with abnormal findings: Secondary | ICD-10-CM | POA: Diagnosis not present

## 2024-11-25 DIAGNOSIS — Z1231 Encounter for screening mammogram for malignant neoplasm of breast: Secondary | ICD-10-CM

## 2024-11-25 DIAGNOSIS — Z01419 Encounter for gynecological examination (general) (routine) without abnormal findings: Secondary | ICD-10-CM

## 2024-11-25 LAB — CERVICOVAGINAL ANCILLARY ONLY
Bacterial Vaginitis (gardnerella): NEGATIVE
Candida Glabrata: NEGATIVE
Candida Vaginitis: NEGATIVE
Chlamydia: NEGATIVE
Comment: NEGATIVE
Comment: NEGATIVE
Comment: NEGATIVE
Comment: NEGATIVE
Comment: NEGATIVE
Comment: NORMAL
Neisseria Gonorrhea: NEGATIVE
Trichomonas: NEGATIVE

## 2024-11-25 LAB — POCT URINALYSIS DIPSTICK
Bilirubin, UA: NEGATIVE
Blood, UA: POSITIVE
Glucose, UA: NEGATIVE
Ketones, UA: NEGATIVE
Nitrite, UA: NEGATIVE
Protein, UA: NEGATIVE
Spec Grav, UA: 1.01
Urobilinogen, UA: 0.2 U/dL
pH, UA: 6.5

## 2024-11-25 MED ORDER — MEGESTROL ACETATE 40 MG PO TABS: 40.0000 mg | ORAL_TABLET | Freq: Every day | ORAL | 3 refills | Status: AC

## 2024-11-25 NOTE — Progress Notes (Deleted)
 "  ANNUAL GYNECOLOGICAL EXAM  HPI  Betty Vasquez is a 54 y.o.-year-old [id number] who presents for an annual gynecological exam today.  She admits pelvic pain and UTI Symptoms. Denies dyspareunia, abnormal vaginal bleeding and discharge.  Medical/Surgical History Past Medical History:  Diagnosis Date   Anxiety    Hyperlipidemia    Hypertension    UTI (urinary tract infection) 03/13/2024   Past Surgical History:  Procedure Laterality Date   APPENDECTOMY     BREAST BIOPSY Left 02/13/2023   Left Breast Stereo Bx X clip - BENIGN MAMMARY PARENCHYMA SHOWING FIBROCYSTIC CHANGES, COLUMNAR CELL CHANGE WITHOUT ATYPIA, FOCAL ATYPICAL LOBULAR HYPERPLASIA, AND FOCAL USUAL DUCTAL HYPERPLASIA, WITH ASSOCIATED CALCIFICATIONS. - NEGATIVE FOR ATYPICAL DUCTAL HYPERPLASIA, DUCTAL CARCINOMA IN SITU, AND MALIGNANCY.   BREAST BIOPSY Left 02/13/2023   MM LT BREAST BX W LOC DEV 1ST LESION IMAGE BX SPEC STEREO GUIDE 02/13/2023 [facility]   CESAREAN SECTION     2   LAPAROSCOPIC APPENDECTOMY N/A 11/17/2021   Procedure: APPENDECTOMY LAPAROSCOPIC;  Surgeon: Desiderio Schanz, MD;  Location: [facility];  Service: General;  Laterality: N/A;   TUBAL LIGATION      Social History Lives with Husband and child. Yes, Feels safe there Work: Unemployed Exercise: 7 days a week for 20 min Substances: Denies, EtOH, tobacco, vape, and recreational drugs  Social Drivers of Health   Tobacco Use: Low Risk (11/25/2024)   Patient History    Smoking Tobacco Use: Never    Smokeless Tobacco Use: Never    Passive Exposure: Not on file  Financial Resource Strain: Not on file  Food Insecurity: No Food Insecurity (11/25/2024)   Epic    Worried About Programme Researcher, Broadcasting/film/video in the Last Year: Never true    Ran Out of Food in the Last Year: Never true  Transportation Needs: No Transportation Needs (11/25/2024)   Epic    Lack of Transportation (Medical): No    Lack of Transportation (Non-Medical): No  Physical Activity:  Insufficiently Active (11/25/2024)   Exercise Vital Sign    Days of Exercise per Week: 7 days    Minutes of Exercise per Session: 20 min  Stress: Not on file  Social Connections: Not on file  Depression (PHQ2-9): Low Risk (11/25/2024)   Depression (PHQ2-9)    PHQ-2 Score: 1  Alcohol Screen: Low Risk (11/25/2024)   Alcohol Screen    Last Alcohol Screening Score (AUDIT): 0  Housing: Low Risk (11/25/2024)   Epic    Unable to Pay for Housing in the Last Year: No    Number of Times Moved in the Last Year: 0    Homeless in the Last Year: No  Utilities: Not At Risk (11/25/2024)   Epic    Threatened with loss of utilities: No  Health Literacy: Adequate Health Literacy (11/25/2024)   B1300 Health Literacy    Frequency of need for help with medical instructions: Never     Obstetric History OB History     Gravida  2   Para  2   Term  2   Preterm      AB      Living  2      SAB      IAB      Ectopic      Multiple      Live Births  2            GYN/Menstrual History No LMP recorded. Irregular, every other month. Last Pap: 10/14/23. NILM/HPV neg Contraception:BTL  Prevention Dentist Eye exam Mammogram Colonoscopy Flu shot/vaccines  Current Medications Show/hide medication list[1]      ROS Constitutional: Denied constitutional symptoms, night sweats, recent illness, fatigue, fever, insomnia and weight loss.  Eyes: Denied eye symptoms, eye pain, photophobia, vision change and visual disturbance.  Ears/Nose/Throat/Neck: Denied ear, nose, throat or neck symptoms, hearing loss, nasal discharge, sinus congestion and sore throat.  Cardiovascular: Denied cardiovascular symptoms, arrhythmia, chest pain/pressure, edema, exercise intolerance, orthopnea and palpitations.  Respiratory: Denied pulmonary symptoms, asthma, pleuritic pain, productive sputum, cough, dyspnea and wheezing.  Gastrointestinal: Denied gastro-esophageal reflux, melena, nausea and vomiting.   Genitourinary:*** Denied genitourinary symptoms including symptomatic vaginal discharge, pelvic relaxation issues, and urinary complaints.  Musculoskeletal: Denied musculoskeletal symptoms, stiffness, swelling, muscle weakness and myalgia.  Dermatologic: Denied dermatology symptoms, rash and scar.  Neurologic: Denied neurology symptoms, dizziness, headache, neck pain and syncope.  Psychiatric: Denied psychiatric symptoms, anxiety and depression.  Endocrine: Denied endocrine symptoms including hot flashes and night sweats.    OBJECTIVE  BP (!) 142/86   Pulse 89   Ht 5' 4 (1.626 m)   Wt 151 lb 14.4 oz (68.9 kg)   BMI 26.07 kg/m    Physical examination General NAD, Conversant  HEENT Atraumatic; Op clear with mmm.  Normo-cephalic. Pupils reactive. Anicteric sclerae  Thyroid/Neck Smooth without nodularity or enlargement. Normal ROM.  Neck Supple.  Skin No rashes, lesions or ulceration. Normal palpated skin turgor. No nodularity.  Breasts: No masses or discharge.  Symmetric.  No axillary adenopathy.  Lungs: Clear to auscultation.No rales or wheezes. Normal Respiratory effort, no retractions.  Heart: NSR.  No murmurs or rubs appreciated. No peripheral edema  Abdomen: Soft.  Non-tender.  No masses.  No HSM. No hernia  Extremities: Moves all appropriately.  Normal ROM for age. No lymphadenopathy.  Neuro: Oriented to PPT.  Normal mood. Normal affect.     Pelvic:   Vulva: Normal appearance.  No lesions.  Vagina: No lesions or abnormalities noted.  Support: Normal pelvic support.  Urethra No masses tenderness or scarring.  Meatus Normal size without lesions or prolapse.  Cervix: Normal appearance.  No lesions.  Anus: Normal exam.  No lesions.  Perineum: Normal exam.  No lesions.        Bimanual   Uterus: Normal size.  Non-tender.  Mobile.  AV.  Adnexae: No masses.  Non-tender to palpation.  Cul-de-sac: Negative for abnormality.    ASSESSMENT  1) Annual exam  PLAN 1) Physical  exam as noted. Discussed healthy lifestyle choices and preventive care. 2) *** 3) Return in one year for annual exam or as needed for concerns.   Lauraine Lakes, CNM       [1]  Outpatient Medications Prior to Visit  Medication Sig   amitriptyline (ELAVIL) 25 MG tablet    amLODipine  (NORVASC ) 5 MG tablet Take 5 mg by mouth daily. Take 2 tablets by mouth daily   atorvastatin (LIPITOR) 10 MG tablet Take 10 mg by mouth daily.   DULoxetine  (CYMBALTA ) 20 MG capsule Take 20 mg by mouth daily.   hydrochlorothiazide (HYDRODIURIL) 25 MG tablet Take 25 mg by mouth daily.   losartan (COZAAR) 50 MG tablet Take 50 mg by mouth daily.   amLODipine  (NORVASC ) 10 MG tablet Take 10 mg by mouth daily. (Patient not taking: Reported on 11/25/2024)   megestrol  (MEGACE ) 40 MG tablet Take 1 tablet (40 mg total) by mouth daily. 25 days on 5 days of as directed. (Patient not taking: Reported on 11/25/2024)  phenazopyridine  (PYRIDIUM ) 200 MG tablet Take 1 tablet (200 mg total) by mouth 3 (three) times daily as needed for pain. (Patient not taking: Reported on 11/25/2024)   No facility-administered medications prior to visit.   "

## 2024-11-25 NOTE — Progress Notes (Signed)
 "  ANNUAL GYNECOLOGICAL EXAM  HPI  Betty Vasquez is a 54 y.o.-year-old [id number] who presents for an annual gynecological exam today.  She admits pelvic pain, dyspareunia and UTI Symptoms. Denies abnormal vaginal bleeding and discharge.She reports a constant midline pain below umbilicus that is heavy and worsen with intercourse. Was seen for pain in the ER last July, and again in the office. CT scan  in ER was grossly normal. She was prescribed Megestrol  but never picked up the prescription. She also reports sharp right sided abdominal pain around her menstrual cycle, that resolves after her cycle ends. Patient has made a PCP appointment for 12/02/24.   Medical/Surgical History Past Medical History:  Diagnosis Date   Anxiety    Hyperlipidemia    Hypertension    UTI (urinary tract infection) 03/13/2024   Past Surgical History:  Procedure Laterality Date   APPENDECTOMY     BREAST BIOPSY Left 02/13/2023   Left Breast Stereo Bx X clip - BENIGN MAMMARY PARENCHYMA SHOWING FIBROCYSTIC CHANGES, COLUMNAR CELL CHANGE WITHOUT ATYPIA, FOCAL ATYPICAL LOBULAR HYPERPLASIA, AND FOCAL USUAL DUCTAL HYPERPLASIA, WITH ASSOCIATED CALCIFICATIONS. - NEGATIVE FOR ATYPICAL DUCTAL HYPERPLASIA, DUCTAL CARCINOMA IN SITU, AND MALIGNANCY.   BREAST BIOPSY Left 02/13/2023   MM LT BREAST BX W LOC DEV 1ST LESION IMAGE BX SPEC STEREO GUIDE 02/13/2023 [facility]   CESAREAN SECTION     2   LAPAROSCOPIC APPENDECTOMY N/A 11/17/2021   Procedure: APPENDECTOMY LAPAROSCOPIC;  Surgeon: Desiderio Schanz, MD;  Location: [facility];  Service: General;  Laterality: N/A;   TUBAL LIGATION      Social History Lives with Husband and child. Yes, Feels safe there Work: Unemployed Exercise: 7 days a week for 20 min Substances: Denies, EtOH, tobacco, vape, and recreational drugs  Social Drivers of Health   Tobacco Use: Low Risk (11/25/2024)   Patient History    Smoking Tobacco Use: Never    Smokeless Tobacco Use: Never     Passive Exposure: Not on file  Financial Resource Strain: Not on file  Food Insecurity: No Food Insecurity (11/25/2024)   Epic    Worried About Programme Researcher, Broadcasting/film/video in the Last Year: Never true    Ran Out of Food in the Last Year: Never true  Transportation Needs: No Transportation Needs (11/25/2024)   Epic    Lack of Transportation (Medical): No    Lack of Transportation (Non-Medical): No  Physical Activity: Insufficiently Active (11/25/2024)   Exercise Vital Sign    Days of Exercise per Week: 7 days    Minutes of Exercise per Session: 20 min  Stress: Not on file  Social Connections: Not on file  Depression (PHQ2-9): Low Risk (11/25/2024)   Depression (PHQ2-9)    PHQ-2 Score: 1  Alcohol Screen: Low Risk (11/25/2024)   Alcohol Screen    Last Alcohol Screening Score (AUDIT): 0  Housing: Low Risk (11/25/2024)   Epic    Unable to Pay for Housing in the Last Year: No    Number of Times Moved in the Last Year: 0    Homeless in the Last Year: No  Utilities: Not At Risk (11/25/2024)   Epic    Threatened with loss of utilities: No  Health Literacy: Adequate Health Literacy (11/25/2024)   B1300 Health Literacy    Frequency of need for help with medical instructions: Never     Obstetric History OB History     Gravida  2   Para  2   Term  2   Preterm  AB      Living  2      SAB      IAB      Ectopic      Multiple      Live Births  2            GYN/Menstrual History No LMP recorded. Irregular, every other month. Last Pap: 10/14/23. NILM/HPV neg Contraception:BTL  Prevention Dentist UTD Eye exam UTD Mammogram Referral placed Colonoscopy UTD - 11/15/2033 Flu shot/vaccines   Current Medications Show/hide medication list[1]      ROS Constitutional: Denied constitutional symptoms, night sweats, recent illness, fatigue, fever, insomnia and weight loss.  Eyes: Denied eye symptoms, eye pain, photophobia, vision change and visual disturbance.   Ears/Nose/Throat/Neck: Denied ear, nose, throat or neck symptoms, hearing loss, nasal discharge, sinus congestion and sore throat.  Cardiovascular: Denied cardiovascular symptoms, arrhythmia, chest pain/pressure, edema, exercise intolerance, orthopnea and palpitations.  Respiratory: Denied pulmonary symptoms, asthma, pleuritic pain, productive sputum, cough, dyspnea and wheezing.  Gastrointestinal: Denied gastro-esophageal reflux, melena, nausea and vomiting.  Genitourinary: Denied genitourinary symptoms including symptomatic vaginal discharge, pelvic relaxation issues. Endorses urinary complaints.  Musculoskeletal: Denied musculoskeletal symptoms, stiffness, swelling, muscle weakness and myalgia.  Dermatologic: Denied dermatology symptoms, rash and scar.  Neurologic: Denied neurology symptoms, dizziness, headache, neck pain and syncope.  Psychiatric: Denied psychiatric symptoms, anxiety and depression.  Endocrine: Denied endocrine symptoms including hot flashes and night sweats.    OBJECTIVE  BP (!) 142/86   Pulse 89   Ht 5' 4 (1.626 m)   Wt 68.9 kg   BMI 26.07 kg/m    Physical examination General NAD, Conversant  HEENT Atraumatic; Op clear with mmm.  Normo-cephalic. Pupils reactive. Anicteric sclerae  Thyroid/Neck Smooth without nodularity or enlargement. Normal ROM.  Neck Supple.  Skin No rashes, lesions or ulceration. Normal palpated skin turgor. No nodularity.  Breasts: No masses or discharge.  Symmetric.  No axillary adenopathy.  Lungs: Clear to auscultation.No rales or wheezes. Normal Respiratory effort, no retractions.  Heart: NSR.  No murmurs or rubs appreciated. No peripheral edema  Abdomen: Soft.  Non-tender.  No masses.  No HSM. No hernia  Extremities: Moves all appropriately.  Normal ROM for age. No lymphadenopathy.  Neuro: Oriented to PPT.  Normal mood. Normal affect.     Pelvic:   Vulva: Normal appearance.  No lesions.  Vagina: No lesions or abnormalities noted.   Support: Normal pelvic support.  Urethra No masses tenderness or scarring.  Meatus Normal size without lesions or prolapse.  Cervix: Normal appearance.  No lesions.  Anus: Normal exam.  No lesions.  Perineum: Normal exam.  No lesions.        Bimanual Positive for cervical motion tenderness. No pain illicited bilaterally.  No masses.              ASSESSMENT  1) Annual exam 2) Pelvic Pain  PLAN 1) Physical exam as noted. Discussed healthy lifestyle choices and preventive care. 2) Urine dipped and noted for blood and leukocytes, culture sent. BV/Yeast swab ordered and sent today. Pelvic US  ordered. Megestrol  reordered and patient encouraged to pick up and use. Follow up in 3 months to ascertain effectives of medication. Consider consulting urology is treatment ineffective.  3) Return in one year for annual exam or as needed for concerns.   Rosina Hamilton, SNM        [1]  Outpatient Medications Prior to Visit  Medication Sig   amitriptyline (ELAVIL) 25 MG tablet  amLODipine  (NORVASC ) 5 MG tablet Take 5 mg by mouth daily. Take 2 tablets by mouth daily   atorvastatin (LIPITOR) 10 MG tablet Take 10 mg by mouth daily.   DULoxetine  (CYMBALTA ) 20 MG capsule Take 20 mg by mouth daily.   hydrochlorothiazide (HYDRODIURIL) 25 MG tablet Take 25 mg by mouth daily.   losartan (COZAAR) 50 MG tablet Take 50 mg by mouth daily.   amLODipine  (NORVASC ) 10 MG tablet Take 10 mg by mouth daily. (Patient not taking: Reported on 11/25/2024)   megestrol  (MEGACE ) 40 MG tablet Take 1 tablet (40 mg total) by mouth daily. 25 days on 5 days of as directed. (Patient not taking: Reported on 11/25/2024)   phenazopyridine  (PYRIDIUM ) 200 MG tablet Take 1 tablet (200 mg total) by mouth 3 (three) times daily as needed for pain. (Patient not taking: Reported on 11/25/2024)   No facility-administered medications prior to visit.   "

## 2024-11-27 LAB — URINE CULTURE

## 2024-12-15 ENCOUNTER — Other Ambulatory Visit
